# Patient Record
Sex: Female | Born: 1940 | Race: White | Hispanic: No | Marital: Single | Smoking: Never smoker
Health system: Southern US, Community
[De-identification: ages and names within clinical notes are randomized; demographics above are authoritative.]

## PROBLEM LIST (undated history)

## (undated) DIAGNOSIS — E039 Hypothyroidism, unspecified: Secondary | ICD-10-CM

## (undated) DIAGNOSIS — F32A Depression, unspecified: Secondary | ICD-10-CM

## (undated) DIAGNOSIS — M1990 Unspecified osteoarthritis, unspecified site: Secondary | ICD-10-CM

## (undated) DIAGNOSIS — E785 Hyperlipidemia, unspecified: Secondary | ICD-10-CM

## (undated) DIAGNOSIS — N1831 Chronic kidney disease, stage 3a: Secondary | ICD-10-CM

## (undated) DIAGNOSIS — K219 Gastro-esophageal reflux disease without esophagitis: Secondary | ICD-10-CM

## (undated) HISTORY — PX: DILATION AND CURETTAGE OF UTERUS: SHX78

---

## 1963-10-07 HISTORY — PX: WISDOM TOOTH EXTRACTION: SHX21

## 2009-11-15 ENCOUNTER — Ambulatory Visit: Payer: Self-pay | Admitting: Nephrology

## 2010-08-14 IMAGING — CR DG HIP COMPLETE 2+V*L*
1 series · 2 of 2 positions shown · non-contrast
Comparison: none

REASON FOR EXAM: lt hip pain x 2 months;hx arthritis & osteoporsis fax
result [id number]
COMMENTS:

PROCEDURE:     MDR - MDR HIP LEFT COMPLETE  - November 15, 2009  [DATE]
RESULT:     AP and lateral views of the left hip reveal the bones to be
adequately mineralized for age. I do not see evidence of an acute fracture.
No significant degenerative change is seen.

[Series 1: view not recorded · 0.17mm/px · 2 of 2 slices shown]
[im 1/2]
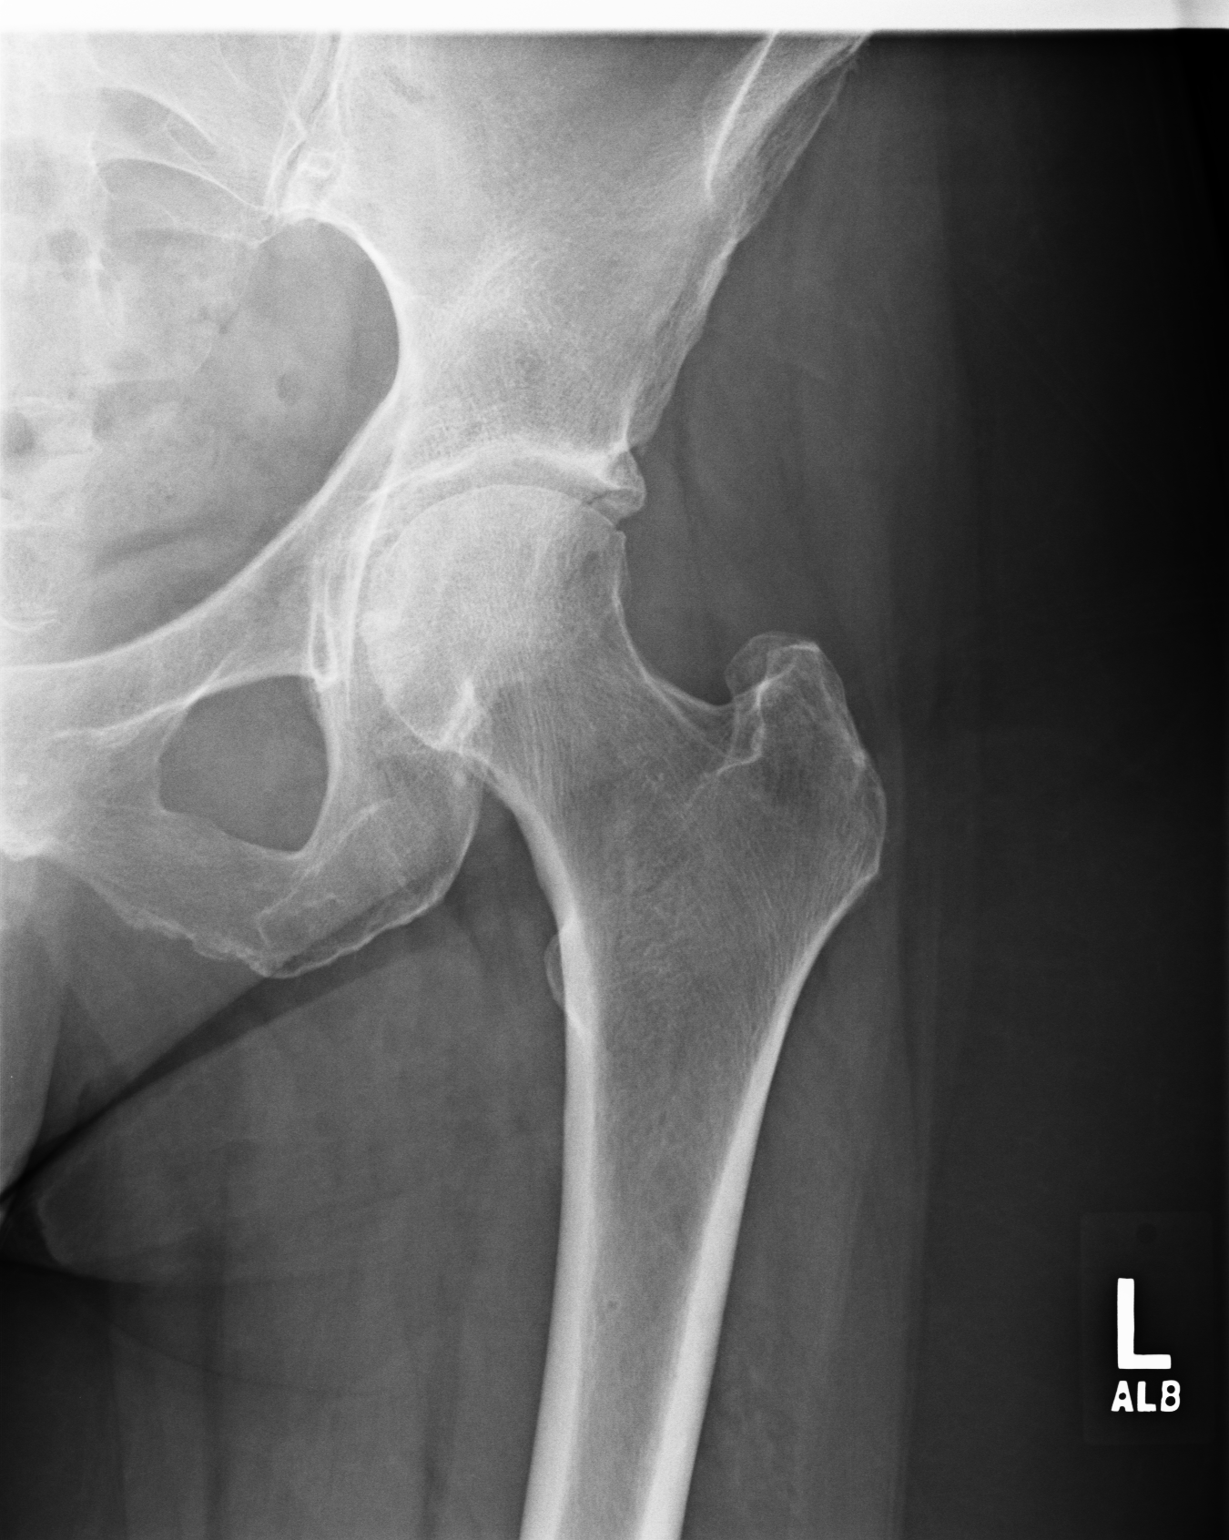
[im 2/2]
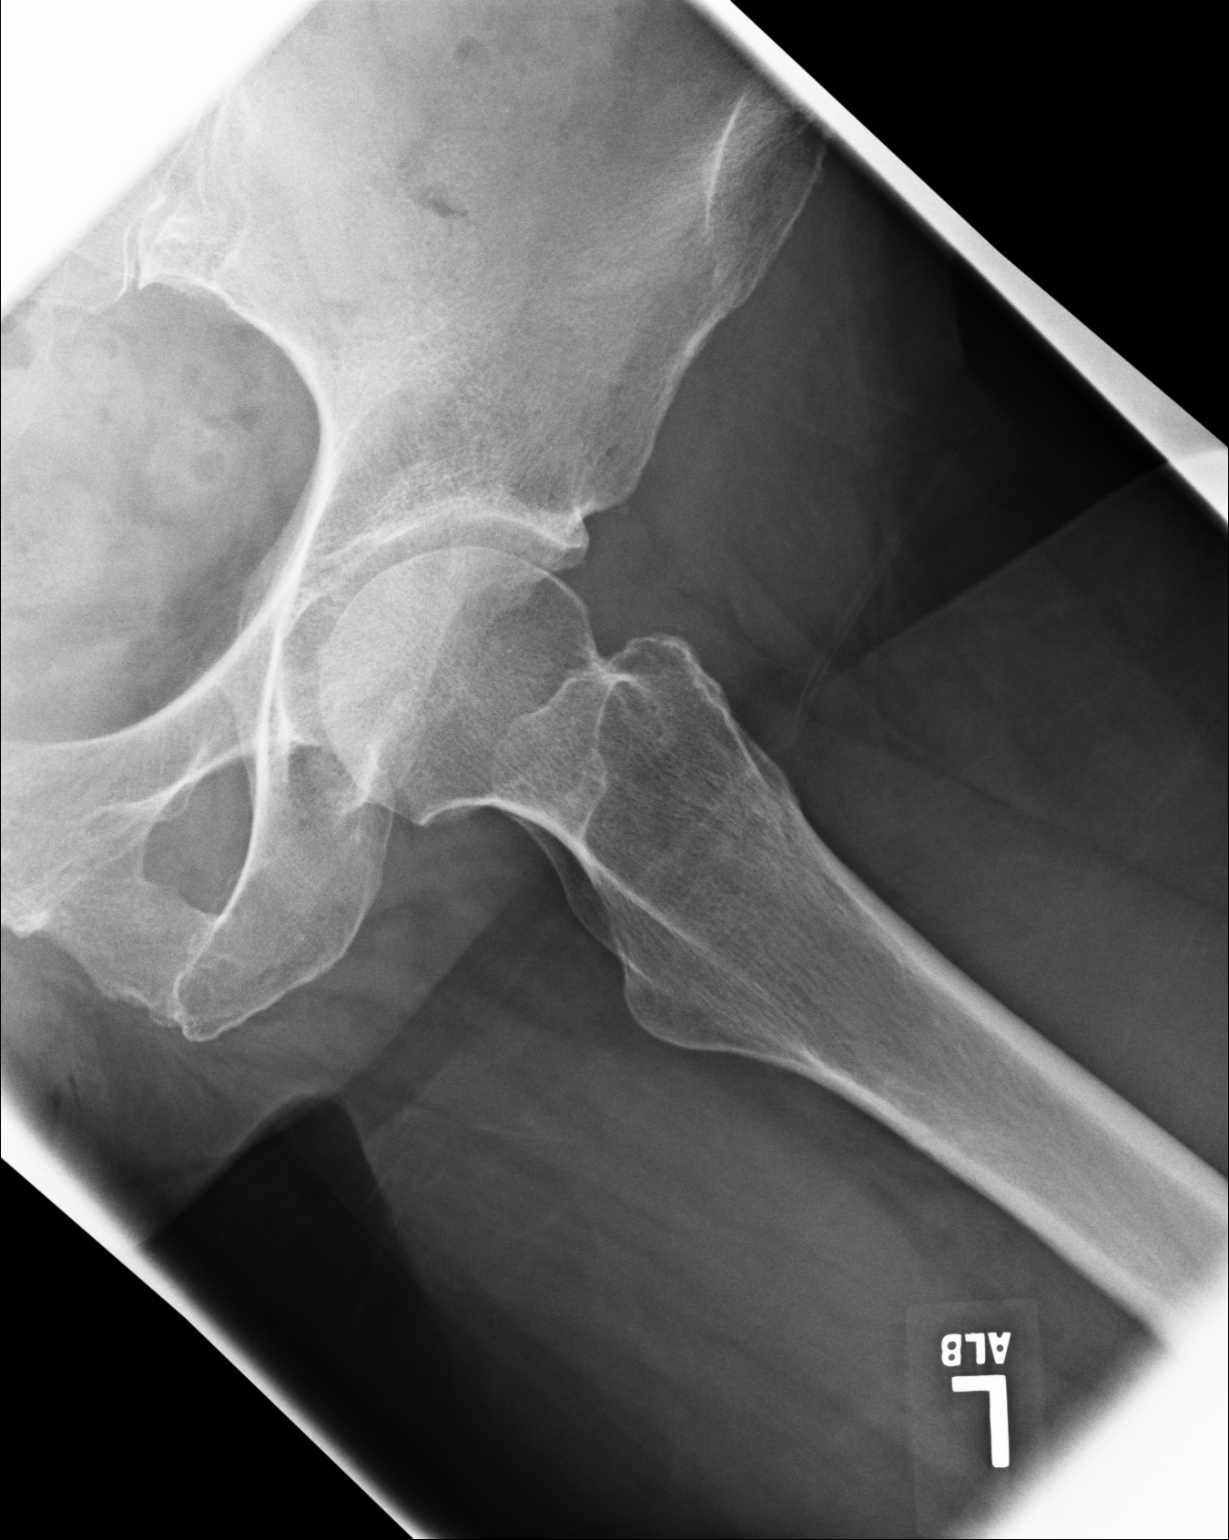

[2 of 2 positions shown; findings below may reference images not displayed]

IMPRESSION: I see no acute abnormality of the left hip. Given the
patient's persistent symptoms, followup MRI may be useful especially if
entities such as avascular necrosis are suspected.

## 2010-10-22 ENCOUNTER — Ambulatory Visit: Payer: Self-pay | Admitting: Internal Medicine

## 2011-07-30 ENCOUNTER — Ambulatory Visit: Payer: Self-pay | Admitting: Family Medicine

## 2012-01-01 ENCOUNTER — Ambulatory Visit: Payer: Self-pay | Admitting: Family Medicine

## 2016-07-19 ENCOUNTER — Ambulatory Visit
Admission: EM | Admit: 2016-07-19 | Discharge: 2016-07-19 | Disposition: A | Discharge status: Home / Self Care | Payer: Medicare Other

## 2020-04-23 ENCOUNTER — Other Ambulatory Visit: Payer: Self-pay

## 2020-07-17 NOTE — H&P (View-Only) (Signed)
Gastroenterology Consultation  Referring Provider:     Alan Mulder, MD Primary Care Physician:  Alan Mulder, MD Primary Gastroenterologist:  Dr. Servando Snare     Reason for Consultation:     Iron deficiency anemia and GERD        HPI:   Sarah Barrera is a 79 y.o. y/o female referred for consultation & management of iron deficiency anemia and GERD by Dr. Patrecia Pace, Delsa Sale, MD.  This patient was sent to me for evaluation for possible EGD and colonoscopy.  The patient had seen her primary care provider and per his note was reported to have iron deficiency anemia.  The patient was also noted to have heartburn and had stopped taking a H2 blocker and was on a proton pump inhibitor with good relief of her symptoms. The patient denies ever having an EGD or colonoscopy the past.  The patient is concerned because she has a relative whose heartburn has has resulted in esophageal cancer which has been metastatic and she is now concerned about her longstanding heartburn.  The patient is also concerned about taking a PPI.  There is no report of any unexplained weight loss fevers chills nausea vomiting black stools or bloody stools.    No past medical history on file.    Prior to Admission medications   Not on File    No family history on file.   Social History   Tobacco Use  . Smoking status: Not on file  Substance Use Topics  . Alcohol use: Not on file  . Drug use: Not on file    Allergies as of 07/18/2020 - Review Complete 07/18/2020  Allergen Reaction Noted  . Penicillin g  07/20/2014  . Venlafaxine  07/20/2014    Review of Systems:    All systems reviewed and negative except where noted in HPI.   Physical Exam:  Wt 176 lb 9.6 oz (80.1 kg)  No LMP recorded. General:   Alert,  Well-developed, well-nourished, pleasant and cooperative in NAD Head:  Normocephalic and atraumatic. Eyes:  Sclera clear, no icterus.   Conjunctiva pink. Ears:  Normal auditory acuity. Neck:   Supple; no masses or thyromegaly. Lungs:  Respirations even and unlabored.  Clear throughout to auscultation.   No wheezes, crackles, or rhonchi. No acute distress. Heart:  Regular rate and rhythm; no murmurs, clicks, rubs, or gallops. Abdomen:  Normal bowel sounds.  No bruits.  Soft, non-tender and non-distended without masses, hepatosplenomegaly or hernias noted.  No guarding or rebound tenderness.  Negative Carnett sign.   Rectal:  Deferred.  Pulses:  Normal pulses noted. Extremities:  No clubbing or edema.  No cyanosis. Neurologic:  Alert and oriented x3;  grossly normal neurologically. Skin:  Intact without significant lesions or rashes.  No jaundice. Lymph Nodes:  No significant cervical adenopathy. Psych:  Alert and cooperative. Normal mood and affect.  Imaging Studies: No results found.  Assessment and Plan:   Sarah Barrera is a 79 y.o. y/o female who comes in today with a history of chronic heartburn with some concern of taking PPI.  The patient has been explained the risks of a PPI in addition to the risk of having chronic heartburn.  The patient has been told that due to her referral stating that she had iron deficient anemia and chronic GERD she should be set up for an EGD and colonoscopy.  The patient has reluctantly accepted that recommendation and will be set up for an EGD  and colonoscopy.  The patient has been explained the plan agrees with it.    Lydian Chavous, MD. FACG    Note: This dictation was prepared with Dragon dictation along with smaller phrase technology. Any transcriptional errors that result from this process are unintentional.   

## 2020-07-17 NOTE — Progress Notes (Signed)
Gastroenterology Consultation  Referring Provider:     Alan Mulder, MD Primary Care Physician:  Alan Mulder, MD Primary Gastroenterologist:  Dr. Servando Snare     Reason for Consultation:     Iron deficiency anemia and GERD        HPI:   KOURTNEI RAUBER is a 79 y.o. y/o female referred for consultation & management of iron deficiency anemia and GERD by Dr. Patrecia Pace, Delsa Sale, MD.  This patient was sent to me for evaluation for possible EGD and colonoscopy.  The patient had seen her primary care provider and per his note was reported to have iron deficiency anemia.  The patient was also noted to have heartburn and had stopped taking a H2 blocker and was on a proton pump inhibitor with good relief of her symptoms. The patient denies ever having an EGD or colonoscopy the past.  The patient is concerned because she has a relative whose heartburn has has resulted in esophageal cancer which has been metastatic and she is now concerned about her longstanding heartburn.  The patient is also concerned about taking a PPI.  There is no report of any unexplained weight loss fevers chills nausea vomiting black stools or bloody stools.    No past medical history on file.    Prior to Admission medications   Not on File    No family history on file.   Social History   Tobacco Use  . Smoking status: Not on file  Substance Use Topics  . Alcohol use: Not on file  . Drug use: Not on file    Allergies as of 07/18/2020 - Review Complete 07/18/2020  Allergen Reaction Noted  . Penicillin g  07/20/2014  . Venlafaxine  07/20/2014    Review of Systems:    All systems reviewed and negative except where noted in HPI.   Physical Exam:  Wt 176 lb 9.6 oz (80.1 kg)  No LMP recorded. General:   Alert,  Well-developed, well-nourished, pleasant and cooperative in NAD Head:  Normocephalic and atraumatic. Eyes:  Sclera clear, no icterus.   Conjunctiva pink. Ears:  Normal auditory acuity. Neck:   Supple; no masses or thyromegaly. Lungs:  Respirations even and unlabored.  Clear throughout to auscultation.   No wheezes, crackles, or rhonchi. No acute distress. Heart:  Regular rate and rhythm; no murmurs, clicks, rubs, or gallops. Abdomen:  Normal bowel sounds.  No bruits.  Soft, non-tender and non-distended without masses, hepatosplenomegaly or hernias noted.  No guarding or rebound tenderness.  Negative Carnett sign.   Rectal:  Deferred.  Pulses:  Normal pulses noted. Extremities:  No clubbing or edema.  No cyanosis. Neurologic:  Alert and oriented x3;  grossly normal neurologically. Skin:  Intact without significant lesions or rashes.  No jaundice. Lymph Nodes:  No significant cervical adenopathy. Psych:  Alert and cooperative. Normal mood and affect.  Imaging Studies: No results found.  Assessment and Plan:   PAULETTE ROCKFORD is a 79 y.o. y/o female who comes in today with a history of chronic heartburn with some concern of taking PPI.  The patient has been explained the risks of a PPI in addition to the risk of having chronic heartburn.  The patient has been told that due to her referral stating that she had iron deficient anemia and chronic GERD she should be set up for an EGD and colonoscopy.  The patient has reluctantly accepted that recommendation and will be set up for an EGD  and colonoscopy.  The patient has been explained the plan agrees with it.    Midge Minium, MD. Clementeen Graham    Note: This dictation was prepared with Dragon dictation along with smaller phrase technology. Any transcriptional errors that result from this process are unintentional.

## 2020-07-18 ENCOUNTER — Ambulatory Visit: Payer: Medicare PPO | Admitting: Gastroenterology

## 2020-07-18 ENCOUNTER — Other Ambulatory Visit: Payer: Self-pay

## 2020-07-18 VITALS — BP 98/67 | HR 85 | Ht 62.0 in | Wt 176.6 lb

## 2020-07-18 DIAGNOSIS — M25579 Pain in unspecified ankle and joints of unspecified foot: Secondary | ICD-10-CM | POA: Insufficient documentation

## 2020-07-18 DIAGNOSIS — D500 Iron deficiency anemia secondary to blood loss (chronic): Secondary | ICD-10-CM

## 2020-07-18 DIAGNOSIS — Q6650 Congenital pes planus, unspecified foot: Secondary | ICD-10-CM | POA: Insufficient documentation

## 2020-07-18 DIAGNOSIS — K219 Gastro-esophageal reflux disease without esophagitis: Secondary | ICD-10-CM

## 2020-07-18 DIAGNOSIS — M76829 Posterior tibial tendinitis, unspecified leg: Secondary | ICD-10-CM | POA: Insufficient documentation

## 2020-07-18 MED ORDER — SUPREP BOWEL PREP KIT 17.5-3.13-1.6 GM/177ML PO SOLN: 1.0000 | ORAL | 0 refills | Status: DC

## 2020-07-19 ENCOUNTER — Other Ambulatory Visit: Payer: Self-pay

## 2020-07-19 ENCOUNTER — Encounter: Payer: Self-pay | Admitting: Gastroenterology

## 2020-07-19 ENCOUNTER — Other Ambulatory Visit
Admission: RE | Admit: 2020-07-19 | Discharge: 2020-07-19 | Disposition: A | Discharge status: Home / Self Care | Payer: Medicare PPO | Source: Ambulatory Visit | Attending: Gastroenterology | Admitting: Gastroenterology

## 2020-07-19 DIAGNOSIS — Z20822 Contact with and (suspected) exposure to covid-19: Secondary | ICD-10-CM | POA: Insufficient documentation

## 2020-07-19 DIAGNOSIS — Z01812 Encounter for preprocedural laboratory examination: Secondary | ICD-10-CM | POA: Insufficient documentation

## 2020-07-19 LAB — SARS CORONAVIRUS 2 (TAT 6-24 HRS): SARS Coronavirus 2: NEGATIVE

## 2020-07-19 NOTE — Discharge Instructions (Signed)
General Anesthesia, Adult, Care After This sheet gives you information about how to care for yourself after your procedure. Your health care provider may also give you more specific instructions. If you have problems or questions, contact your health care provider. What can I expect after the procedure? After the procedure, the following side effects are common:  Pain or discomfort at the IV site.  Nausea.  Vomiting.  Sore throat.  Trouble concentrating.  Feeling cold or chills.  Weak or tired.  Sleepiness and fatigue.  Soreness and body aches. These side effects can affect parts of the body that were not involved in surgery. Follow these instructions at home:  For at least 24 hours after the procedure:  Have a responsible adult stay with you. It is important to have someone help care for you until you are awake and alert.  Rest as needed.  Do not: ? Participate in activities in which you could fall or become injured. ? Drive. ? Use heavy machinery. ? Drink alcohol. ? Take sleeping pills or medicines that cause drowsiness. ? Make important decisions or sign legal documents. ? Take care of children on your own. Eating and drinking  Follow any instructions from your health care provider about eating or drinking restrictions.  When you feel hungry, start by eating small amounts of foods that are soft and easy to digest (bland), such as toast. Gradually return to your regular diet.  Drink enough fluid to keep your urine pale yellow.  If you vomit, rehydrate by drinking water, juice, or clear broth. General instructions  If you have sleep apnea, surgery and certain medicines can increase your risk for breathing problems. Follow instructions from your health care provider about wearing your sleep device: ? Anytime you are sleeping, including during daytime naps. ? While taking prescription pain medicines, sleeping medicines, or medicines that make you drowsy.  Return to  your normal activities as told by your health care provider. Ask your health care provider what activities are safe for you.  Take over-the-counter and prescription medicines only as told by your health care provider.  If you smoke, do not smoke without supervision.  Keep all follow-up visits as told by your health care provider. This is important. Contact a health care provider if:  You have nausea or vomiting that does not get better with medicine.  You cannot eat or drink without vomiting.  You have pain that does not get better with medicine.  You are unable to pass urine.  You develop a skin rash.  You have a fever.  You have redness around your IV site that gets worse. Get help right away if:  You have difficulty breathing.  You have chest pain.  You have blood in your urine or stool, or you vomit blood. Summary  After the procedure, it is common to have a sore throat or nausea. It is also common to feel tired.  Have a responsible adult stay with you for the first 24 hours after general anesthesia. It is important to have someone help care for you until you are awake and alert.  When you feel hungry, start by eating small amounts of foods that are soft and easy to digest (bland), such as toast. Gradually return to your regular diet.  Drink enough fluid to keep your urine pale yellow.  Return to your normal activities as told by your health care provider. Ask your health care provider what activities are safe for you. This information is not   intended to replace advice given to you by your health care provider. Make sure you discuss any questions you have with your health care provider. Document Revised: 09/25/2017 Document Reviewed: 05/08/2017 Elsevier Patient Education  2020 Elsevier Inc.  

## 2020-07-23 ENCOUNTER — Encounter
Admission: RE | Disposition: A | Discharge status: Home / Self Care | Payer: Self-pay | Source: Home / Self Care | Attending: Gastroenterology

## 2020-07-23 ENCOUNTER — Ambulatory Visit: Payer: Medicare PPO | Admitting: Anesthesiology

## 2020-07-23 ENCOUNTER — Other Ambulatory Visit: Payer: Self-pay

## 2020-07-23 ENCOUNTER — Ambulatory Visit
Admission: RE | Admit: 2020-07-23 | Discharge: 2020-07-23 | Disposition: A | Discharge status: Home / Self Care | Payer: Medicare PPO | Attending: Gastroenterology | Admitting: Gastroenterology

## 2020-07-23 ENCOUNTER — Encounter: Payer: Self-pay | Admitting: Gastroenterology

## 2020-07-23 DIAGNOSIS — D500 Iron deficiency anemia secondary to blood loss (chronic): Secondary | ICD-10-CM | POA: Diagnosis not present

## 2020-07-23 DIAGNOSIS — K449 Diaphragmatic hernia without obstruction or gangrene: Secondary | ICD-10-CM | POA: Insufficient documentation

## 2020-07-23 DIAGNOSIS — K640 First degree hemorrhoids: Secondary | ICD-10-CM | POA: Insufficient documentation

## 2020-07-23 DIAGNOSIS — Z800 Family history of malignant neoplasm of digestive organs: Secondary | ICD-10-CM | POA: Insufficient documentation

## 2020-07-23 DIAGNOSIS — K219 Gastro-esophageal reflux disease without esophagitis: Secondary | ICD-10-CM | POA: Diagnosis not present

## 2020-07-23 DIAGNOSIS — D509 Iron deficiency anemia, unspecified: Secondary | ICD-10-CM | POA: Insufficient documentation

## 2020-07-23 HISTORY — PX: ESOPHAGOGASTRODUODENOSCOPY (EGD) WITH PROPOFOL: SHX5813

## 2020-07-23 HISTORY — PX: COLONOSCOPY WITH PROPOFOL: SHX5780

## 2020-07-23 HISTORY — DX: Hypothyroidism, unspecified: E03.9

## 2020-07-23 HISTORY — DX: Hyperlipidemia, unspecified: E78.5

## 2020-07-23 HISTORY — DX: Gastro-esophageal reflux disease without esophagitis: K21.9

## 2020-07-23 SURGERY — COLONOSCOPY WITH PROPOFOL
Anesthesia: General | Site: Throat

## 2020-07-23 MED ORDER — LACTATED RINGERS IV SOLN: INTRAVENOUS | Status: DC

## 2020-07-23 MED ORDER — ONDANSETRON HCL 4 MG/2ML IJ SOLN: 4.0000 mg | Freq: Once | INTRAMUSCULAR | Status: DC | PRN

## 2020-07-23 MED ORDER — ACETAMINOPHEN 10 MG/ML IV SOLN: 1000.0000 mg | Freq: Once | INTRAVENOUS | Status: DC | PRN

## 2020-07-23 MED ORDER — PROPOFOL 10 MG/ML IV BOLUS
INTRAVENOUS | Status: DC | PRN
  Administered 2020-07-23 (×2): 40 mg via INTRAVENOUS
  Administered 2020-07-23: 150 mg via INTRAVENOUS
  Administered 2020-07-23: 40 mg via INTRAVENOUS
  Administered 2020-07-23: 50 mg via INTRAVENOUS

## 2020-07-23 MED ORDER — STERILE WATER FOR IRRIGATION IR SOLN
Status: DC | PRN
  Administered 2020-07-23: .05 mL

## 2020-07-23 MED ORDER — GLYCOPYRROLATE 0.2 MG/ML IJ SOLN
INTRAMUSCULAR | Status: DC | PRN
  Administered 2020-07-23: .1 mg via INTRAVENOUS

## 2020-07-23 MED ORDER — LIDOCAINE HCL (CARDIAC) PF 100 MG/5ML IV SOSY
PREFILLED_SYRINGE | INTRAVENOUS | Status: DC | PRN
  Administered 2020-07-23: 30 mg via INTRAVENOUS

## 2020-07-23 SURGICAL SUPPLY — 7 items
BLOCK BITE 60FR ADLT L/F GRN (MISCELLANEOUS) ×4 IMPLANT
GOWN CVR UNV OPN BCK APRN NK (MISCELLANEOUS) ×4 IMPLANT
GOWN ISOL THUMB LOOP REG UNIV (MISCELLANEOUS) ×8
KIT PRC NS LF DISP ENDO (KITS) ×2 IMPLANT
KIT PROCEDURE OLYMPUS (KITS) ×4
MANIFOLD NEPTUNE II (INSTRUMENTS) ×4 IMPLANT
WATER STERILE IRR 250ML POUR (IV SOLUTION) ×4 IMPLANT

## 2020-07-23 NOTE — Interval H&P Note (Signed)
History and Physical Interval Note:  07/23/2020 8:19 AM  Sarah Barrera  has presented today for surgery, with the diagnosis of Iron deficiency anemia D50.0 GERD K21.9.  The various methods of treatment have been discussed with the patient and family. After consideration of risks, benefits and other options for treatment, the patient has consented to  Procedure(s): COLONOSCOPY WITH PROPOFOL (N/A) ESOPHAGOGASTRODUODENOSCOPY (EGD) WITH PROPOFOL (N/A) as a surgical intervention.  The patient's history has been reviewed, patient examined, no change in status, stable for surgery.  I have reviewed the patient's chart and labs.  Questions were answered to the patient's satisfaction.     Arlyss Weathersby [employer]

## 2020-07-23 NOTE — Op Note (Signed)
[facility] Gastroenterology Patient Name: Sarah Barrera Procedure Date: 07/23/2020 8:55 AM MRN: [id number] Account #: [id number] Date of Birth: [birth year] Admit Type: Outpatient Age: 79 Room: [facility] OR ROOM 01 Gender: Female Note Status: Finalized Procedure:             Colonoscopy Indications:           Iron deficiency anemia Providers:             Midge Minium MD, MD Referring MD:          Alan Mulder, MD (Referring MD) Medicines:             Propofol per Anesthesia Complications:         No immediate complications. Procedure:             Pre-Anesthesia Assessment:                        - Prior to the procedure, a History and Physical was                         performed, and patient medications and allergies were                         reviewed. The patient's tolerance of previous                         anesthesia was also reviewed. The risks and benefits                         of the procedure and the sedation options and risks                         were discussed with the patient. All questions were                         answered, and informed consent was obtained. Prior                         Anticoagulants: The patient has taken no previous                         anticoagulant or antiplatelet agents. ASA Grade                         Assessment: II - A patient with mild systemic disease.                         After reviewing the risks and benefits, the patient                         was deemed in satisfactory condition to undergo the                         procedure.                        After obtaining informed consent, the colonoscope was  passed under direct vision. Throughout the procedure,                         the patient's blood pressure, pulse, and oxygen                         saturations were monitored continuously. The was                         introduced through the anus and advanced to the the                          cecum, identified by appendiceal orifice and ileocecal                         valve. The colonoscopy was performed without                         difficulty. The patient tolerated the procedure well.                         The quality of the bowel preparation was excellent. Findings:      The perianal and digital rectal examinations were normal.      Non-bleeding internal hemorrhoids were found during retroflexion. The       hemorrhoids were Grade I (internal hemorrhoids that do not prolapse). Impression:            - Non-bleeding internal hemorrhoids.                        - No specimens collected. Recommendation:        - Discharge patient to home.                        - Resume previous diet.                        - Continue present medications.                        - To visualize the small bowel, perform video capsule                         endoscopy. Procedure Code(s):     --- Professional ---                        [phone], Colonoscopy, flexible; diagnostic, including                         collection of specimen(s) by brushing or washing, when                         performed (separate procedure) Diagnosis Code(s):     --- Professional ---                        D50.9, Iron deficiency anemia, unspecified CPT copyright 2019 American Medical Association. All rights reserved. The codes documented in this report are preliminary and upon coder review may  be revised to meet current compliance requirements. [name]  [name] MD, MD 07/23/2020 9:25:39 AM This report has been signed electronically. Number of Addenda: 0 Note Initiated On: 07/23/2020 8:55 AM Scope Withdrawal Time: 0 hours 12 minutes 18 seconds  Total Procedure Duration: 0 hours 16 minutes 12 seconds  Estimated Blood Loss:  Estimated blood loss: none.      [facility]

## 2020-07-23 NOTE — Anesthesia Postprocedure Evaluation (Signed)
Anesthesia Post Note  Patient: Sarah Barrera  Procedure(s) Performed: COLONOSCOPY WITH PROPOFOL (N/Barrera ) ESOPHAGOGASTRODUODENOSCOPY (EGD) WITH PROPOFOL (N/Barrera Throat)     Patient location during evaluation: PACU Anesthesia Type: General Level of consciousness: awake and alert Pain management: pain level controlled Vital Signs Assessment: post-procedure vital signs reviewed and stable Respiratory status: spontaneous breathing, nonlabored ventilation, respiratory function stable and patient connected to nasal cannula oxygen Cardiovascular status: blood pressure returned to baseline and stable Postop Assessment: no apparent nausea or vomiting Anesthetic complications: no   No complications documented.  Sarah Barrera  Elky Funches

## 2020-07-23 NOTE — Anesthesia Procedure Notes (Signed)
Date/Time: 07/23/2020 8:56 AM Performed by: Maree Krabbe, CRNA Pre-anesthesia Checklist: Patient identified, Emergency Drugs available, Suction available, Timeout performed and Patient being monitored Patient Re-evaluated:Patient Re-evaluated prior to induction Oxygen Delivery Method: Nasal cannula Placement Confirmation: positive ETCO2

## 2020-07-23 NOTE — Op Note (Signed)
[facility] Gastroenterology Patient Name: Sarah Barrera Procedure Date: 07/23/2020 8:53 AM MRN: [id number] Account #: [id number] Date of Birth: Jul 20, 1941 Admit Type: Outpatient Age: 79 Room: [facility] OR ROOM 01 Gender: Female Note Status: Finalized Procedure:             Upper GI endoscopy Indications:           Iron deficiency anemia Providers:             Midge Minium MD, MD Referring MD:          Alan Mulder, MD (Referring MD) Medicines:             Propofol per Anesthesia Complications:         No immediate complications. Procedure:             Pre-Anesthesia Assessment:                        - Prior to the procedure, a History and Physical was                         performed, and patient medications and allergies were                         reviewed. The patient's tolerance of previous                         anesthesia was also reviewed. The risks and benefits                         of the procedure and the sedation options and risks                         were discussed with the patient. All questions were                         answered, and informed consent was obtained. Prior                         Anticoagulants: The patient has taken no previous                         anticoagulant or antiplatelet agents. ASA Grade                         Assessment: II - A patient with mild systemic disease.                         After reviewing the risks and benefits, the patient                         was deemed in satisfactory condition to undergo the                         procedure.                        After obtaining informed consent, the endoscope was  passed under direct vision. Throughout the procedure,                         the patient's blood pressure, pulse, and oxygen                         saturations were monitored continuously. The was                         introduced through the mouth, and advanced to  the                         second part of duodenum. The upper GI endoscopy was                         accomplished without difficulty. The patient tolerated                         the procedure well. Findings:      An 8 cm hiatal hernia was present.      The stomach was normal.      The examined duodenum was normal. Impression:            - 8 cm hiatal hernia.                        - Normal stomach.                        - Normal examined duodenum.                        - No specimens collected. Recommendation:        - Discharge patient to home.                        - Resume previous diet.                        - Continue present medications.                        - Perform a colonoscopy today. Procedure Code(s):     --- Professional ---                        [phone], Esophagogastroduodenoscopy, flexible,                         transoral; diagnostic, including collection of                         specimen(s) by brushing or washing, when performed                         (separate procedure) Diagnosis Code(s):     --- Professional ---                        D50.9, Iron deficiency anemia, unspecified CPT copyright 2019 American Medical Association. All rights reserved. The codes documented in this report are preliminary and upon coder review may  be revised to meet  current compliance requirements. Midge Minium MD, MD 07/23/2020 9:05:05 AM This report has been signed electronically. Number of Addenda: 0 Note Initiated On: 07/23/2020 8:53 AM Estimated Blood Loss:  Estimated blood loss: none.      [facility]

## 2020-07-23 NOTE — Anesthesia Preprocedure Evaluation (Signed)
Anesthesia Evaluation  Patient identified by MRN, date of birth, ID band Patient awake    Reviewed: Allergy & Precautions, NPO status , Patient's Chart, lab work & pertinent test results, reviewed documented beta blocker date and time   History of Anesthesia Complications Negative for: history of anesthetic complications  Airway Mallampati: II  TM Distance: <3 FB Neck ROM: Full    Dental   Pulmonary    breath sounds clear to auscultation       Cardiovascular (-) angina(-) DOE  Rhythm:Regular Rate:Normal   HLD   Neuro/Psych    GI/Hepatic GERD  Controlled,  Endo/Other  Hypothyroidism   Renal/GU      Musculoskeletal   Abdominal (+) + obese (BMI 31),   Peds  Hematology   Anesthesia Other Findings   Reproductive/Obstetrics                            Anesthesia Physical Anesthesia Plan  ASA: III  Anesthesia Plan: General   Post-op Pain Management:    Induction: Intravenous  PONV Risk Score and Plan: 3 and Propofol infusion, TIVA and Treatment may vary due to age or medical condition  Airway Management Planned: Natural Airway and Nasal Cannula  Additional Equipment:   Intra-op Plan:   Post-operative Plan:   Informed Consent: I have reviewed the patients History and Physical, chart, labs and discussed the procedure including the risks, benefits and alternatives for the proposed anesthesia with the patient or authorized representative who has indicated his/her understanding and acceptance.       Plan Discussed with: CRNA and Anesthesiologist  Anesthesia Plan Comments:         Anesthesia Quick Evaluation

## 2020-07-23 NOTE — Transfer of Care (Signed)
Immediate Anesthesia Transfer of Care Note  Patient: Sarah Barrera  Procedure(s) Performed: COLONOSCOPY WITH PROPOFOL (N/A ) ESOPHAGOGASTRODUODENOSCOPY (EGD) WITH PROPOFOL (N/A Throat)  Patient Location: PACU  Anesthesia Type: General  Level of Consciousness: awake, alert  and patient cooperative  Airway and Oxygen Therapy: Patient Spontanous Breathing and Patient connected to supplemental oxygen  Post-op Assessment: Post-op Vital signs reviewed, Patient's Cardiovascular Status Stable, Respiratory Function Stable, Patent Airway and No signs of Nausea or vomiting  Post-op Vital Signs: Reviewed and stable  Complications: No complications documented.

## 2020-07-24 ENCOUNTER — Encounter: Payer: Self-pay | Admitting: Gastroenterology

## 2020-08-14 ENCOUNTER — Other Ambulatory Visit: Payer: Self-pay

## 2020-08-14 ENCOUNTER — Telehealth: Payer: Self-pay

## 2020-08-14 DIAGNOSIS — D500 Iron deficiency anemia secondary to blood loss (chronic): Secondary | ICD-10-CM

## 2020-08-14 NOTE — Progress Notes (Signed)
Procedure has been scheduled. Instructions sent via my chart and mailed to home address.

## 2020-08-14 NOTE — Telephone Encounter (Signed)
Called patient to schedule an capsule study per Dr. Servando Snare. LVM to call office back to schedule.

## 2020-09-12 ENCOUNTER — Telehealth: Payer: Self-pay | Admitting: Gastroenterology

## 2020-09-12 NOTE — Telephone Encounter (Signed)
The pacemaker question was addressed back in November. Unsure why she is calling about his again. Nothing further to do for this patient. Waiting on Capsule study scheduled for 09/18/20.

## 2020-09-12 NOTE — Telephone Encounter (Signed)
Patient calling Ginger back stating she does not have a Visual merchandiser. She received a vm asking about it to get approval from ins [employer] for capsule study scheduled for 12.14.21. There was no prior note in system. FYI

## 2020-09-18 ENCOUNTER — Encounter
Admission: RE | Disposition: A | Discharge status: Home / Self Care | Payer: Self-pay | Source: Home / Self Care | Attending: Gastroenterology

## 2020-09-18 ENCOUNTER — Ambulatory Visit
Admission: RE | Admit: 2020-09-18 | Discharge: 2020-09-18 | Disposition: A | Discharge status: Home / Self Care | Payer: Medicare PPO | Attending: Gastroenterology | Admitting: Gastroenterology

## 2020-09-18 DIAGNOSIS — D509 Iron deficiency anemia, unspecified: Secondary | ICD-10-CM

## 2020-09-18 HISTORY — PX: GIVENS CAPSULE STUDY: SHX5432

## 2020-09-18 SURGERY — IMAGING PROCEDURE, GI TRACT, INTRALUMINAL, VIA CAPSULE

## 2020-09-19 ENCOUNTER — Encounter: Payer: Self-pay | Admitting: Gastroenterology

## 2020-10-11 ENCOUNTER — Telehealth: Payer: Self-pay | Admitting: Gastroenterology

## 2020-10-11 NOTE — Telephone Encounter (Signed)
Pt calling for results from her procedure Please call to advise

## 2020-10-11 NOTE — Telephone Encounter (Signed)
Responded to a mychart message that pt had sent in regards to her Capsule study results. Waiting on provider to read and advise.

## 2020-11-01 ENCOUNTER — Other Ambulatory Visit: Payer: Self-pay

## 2020-11-02 ENCOUNTER — Telehealth: Payer: Self-pay

## 2020-11-02 NOTE — Telephone Encounter (Signed)
-----   Message from Darren Wohl, MD sent at 11/01/2020 11:57 AM EST ----- Let the patient know that the capsule study was normal for the entire exam and that there is no sign of any GI bleeding.  She should continue taking iron and following her hemoglobin and hematocrit. 

## 2020-11-02 NOTE — Telephone Encounter (Signed)
-----   Message from Midge Minium, MD sent at 11/01/2020 11:57 AM EST ----- Let the patient know that the capsule study was normal for the entire exam and that there is no sign of any GI bleeding.  She should continue taking iron and following her hemoglobin and hematocrit.

## 2020-11-02 NOTE — Telephone Encounter (Signed)
Pt notified of capsule study results via mychart.  

## 2021-08-18 ENCOUNTER — Encounter: Payer: Self-pay | Admitting: Emergency Medicine

## 2021-08-18 ENCOUNTER — Other Ambulatory Visit: Payer: Self-pay

## 2021-08-18 ENCOUNTER — Ambulatory Visit
Admission: EM | Admit: 2021-08-18 | Discharge: 2021-08-18 | Disposition: A | Discharge status: Home / Self Care | Payer: Medicare PPO | Attending: Physician Assistant | Admitting: Physician Assistant

## 2021-08-18 DIAGNOSIS — E039 Hypothyroidism, unspecified: Secondary | ICD-10-CM | POA: Diagnosis not present

## 2021-08-18 DIAGNOSIS — Z20822 Contact with and (suspected) exposure to covid-19: Secondary | ICD-10-CM | POA: Insufficient documentation

## 2021-08-18 DIAGNOSIS — J101 Influenza due to other identified influenza virus with other respiratory manifestations: Secondary | ICD-10-CM | POA: Diagnosis not present

## 2021-08-18 DIAGNOSIS — R0981 Nasal congestion: Secondary | ICD-10-CM | POA: Diagnosis not present

## 2021-08-18 DIAGNOSIS — R051 Acute cough: Secondary | ICD-10-CM | POA: Insufficient documentation

## 2021-08-18 DIAGNOSIS — I10 Essential (primary) hypertension: Secondary | ICD-10-CM | POA: Insufficient documentation

## 2021-08-18 DIAGNOSIS — R0789 Other chest pain: Secondary | ICD-10-CM | POA: Insufficient documentation

## 2021-08-18 LAB — RESP PANEL BY RT-PCR (FLU A&B, COVID) ARPGX2
Influenza A by PCR: POSITIVE — AB
Influenza B by PCR: NEGATIVE
SARS Coronavirus 2 by RT PCR: NEGATIVE

## 2021-08-18 MED ORDER — OSELTAMIVIR PHOSPHATE 75 MG PO CAPS: 75.0000 mg | ORAL_CAPSULE | Freq: Two times a day (BID) | ORAL | 0 refills | Status: AC

## 2021-08-18 NOTE — Discharge Instructions (Addendum)
-  Flu test is + -I prescribed Tamiflu,  but you will need to contact pharmacies to see if they have it, many are out.  Hopefully this makes symptoms more mild and shorten the course by a day or 2. -Take over the counter cough medication and nasal sprays.  Make sure to rest and increase fluid intake. - Tylenol and/or Motrin as needed for fever control. - Symptoms should resolve within 7 to 10 days.  The first 3 to 5 days people generally feels worse. - You should be seen again if you have uncontrollable fever, weakness, chest pain, breathing difficulty, etc.

## 2021-08-18 NOTE — ED Provider Notes (Signed)
[facility]    CSN: [id number] Arrival date & time: 08/18/21  0806      History   Chief Complaint Chief Complaint  Patient presents with   Sinus Problem   Cough    HPI Sarah Barrera is a 80 y.o. female presenting for 2-day history of fatigue, sinus congestion and pressure, low-grade temps up to 100 degrees, cough and chest tightness.  She reports that on Friday she had a sharp pain of the right ribs which she had to take Tylenol for.  She says it has eased up some.  Denies any breathing difficulty, vomiting or diarrhea.  Has not taken any OTC meds for symptoms.  Patient reports going out of town this past week to [place] and states she rode on a bus.  Unsure if she was around any sick contacts.  No known COVID or flu exposure.  Patient is otherwise healthy with a history of hypertension and hypothyroidism.  She has no other complaints today.  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    Past Surgical History:  Procedure Laterality Date   COLONOSCOPY WITH PROPOFOL N/A 07/23/2020   Procedure: COLONOSCOPY WITH PROPOFOL;  Surgeon: Lucilla Lame, MD;  Location: [place];  Service: Endoscopy;  Laterality: N/A;   DILATION AND CURETTAGE OF UTERUS     several during 1980s   ESOPHAGOGASTRODUODENOSCOPY (EGD) WITH PROPOFOL N/A 07/23/2020   Procedure: ESOPHAGOGASTRODUODENOSCOPY (EGD) WITH PROPOFOL;  Surgeon: Lucilla Lame, MD;  Location: [place];  Service: Endoscopy;  Laterality: N/A;   GIVENS CAPSULE STUDY N/A 09/18/2020   Procedure: GIVENS CAPSULE STUDY;  Surgeon: Lucilla Lame, MD;  Location[facility] ENDOSCOPY;  Service: Endoscopy;  Laterality: N/A;    [place]    OB History   No obstetric history on file.      Home Medications    Prior to Admission medications   Medication Sig Start Date End Date Taking? Authorizing Provider  escitalopram (LEXAPRO) 10 MG tablet  06/13/20  Yes [provider]  Magnesium 250 MG TABS Take by mouth daily.   Yes [provider]  omeprazole (PRILOSEC) 40 MG capsule  06/13/20  Yes [provider]  oseltamivir (TAMIFLU) 75 MG capsule Take 1 capsule (75 mg total) by mouth every 12 (twelve) hours for 5 days. 08/18/21 08/23/21 Yes Laurene Footman B, PA-C  Probiotic Product (PROBIOTIC PO) Take by mouth.   Yes [provider]  rosuvastatin (CRESTOR) 20 MG tablet  04/20/20  Yes [provider]  SYNTHROID 100 MCG tablet  04/20/20  Yes [provider]  Vitamin D, Ergocalciferol, (DRISDOL) 1.25 MG (50000 UNIT) CAPS capsule  06/13/20  Yes [provider]  cyclobenzaprine (FLEXERIL) 10 MG tablet cyclobenzaprine 10 mg tablet  Take 1 tablet every day by oral route as needed. Patient not taking: No sig reported    [provider]  meloxicam (MOBIC) 15 MG tablet meloxicam 15 mg tablet Patient not taking: No sig reported    [provider]  Na Sulfate-K Sulfate-Mg Sulf (SUPREP BOWEL PREP KIT) 17.5-3.13-1.6 GM/177ML SOLN Take 1 kit by mouth as directed. 07/18/20   Wohl,  Darren, MD  oxybutynin (DITROPAN) 5 MG tablet oxybutynin chloride 5 mg tablet 06/14/18   [provider]    Family History History reviewed. No pertinent family history.  Social History Social History   Tobacco Use   Smoking status: Never   Smokeless tobacco: Never  Vaping Use   Vaping Use: Never used  Substance Use Topics   Alcohol use: Not Currently     Allergies   Venlafaxine and Penicillin g   Review of Systems Review of Systems  Constitutional:  Positive for fatigue. Negative for chills, diaphoresis and fever.  HENT:  Positive for  congestion, rhinorrhea and sinus pressure. Negative for ear pain, sinus pain and sore throat.   Respiratory:  Positive for cough and chest tightness. Negative for shortness of breath and wheezing.   Cardiovascular:  Negative for chest pain.  Gastrointestinal:  Negative for abdominal pain, nausea and vomiting.  Musculoskeletal:  Negative for arthralgias and myalgias.  Skin:  Negative for rash.  Neurological:  Positive for headaches. Negative for weakness.  Hematological:  Negative for adenopathy.    Physical Exam Triage Vital Signs ED Triage Vitals  Enc Vitals Group     BP      Pulse      Resp      Temp      Temp src      SpO2      Weight      Height      Head Circumference      Peak Flow      Pain Score      Pain Loc      Pain Edu?      Excl. in [place]?    No data found.  Updated Vital Signs BP 132/85 (BP Location: Left Arm)   Pulse 90   Temp 99.9 F (37.7 C) (Oral)   Resp 14   Ht _0  (1.575 m)   Wt 176 lb (79.8 kg)   SpO2 99%   BMI 32.19 kg/m      Physical Exam Vitals and nursing note reviewed.  Constitutional:      General: She is not in acute distress.    Appearance: Normal appearance. She is not ill-appearing or toxic-appearing.  HENT:     Head: Normocephalic and atraumatic.     Right Ear: Ear canal and external ear normal. Decreased hearing noted. A middle ear effusion is present. Tympanic membrane is not erythematous or bulging.     Left Ear: Ear canal and external ear normal. Decreased hearing noted. A middle ear effusion is present. Tympanic membrane is not erythematous or bulging.     Nose: Congestion present.     Mouth/Throat:     Mouth: Mucous membranes are moist.     Pharynx: Oropharynx is clear. Posterior oropharyngeal erythema (mild with clear PND) present.  Eyes:     General: No scleral icterus.       Right eye: No discharge.        Left eye: No discharge.     Conjunctiva/sclera: Conjunctivae normal.  Cardiovascular:     Rate and Rhythm:  Normal rate and regular rhythm.     Heart sounds: Normal heart sounds.  Pulmonary:     Effort: Pulmonary effort is normal. No respiratory distress.     Breath sounds: Normal breath sounds.  Musculoskeletal:     Cervical back: Neck supple.  Skin:    General: Skin is dry.  Neurological:     General: No focal deficit present.  Mental Status: She is alert. Mental status is at baseline.     Motor: No weakness.     Gait: Gait normal.  Psychiatric:        Mood and Affect: Mood normal.        Behavior: Behavior normal.        Thought Content: Thought content normal.     UC Treatments / Results  Labs (all labs ordered are listed, but only abnormal results are displayed) Labs Reviewed  RESP PANEL BY RT-PCR (FLU A&B, COVID) ARPGX2 - Abnormal; Notable for the following components:      Result Value   Influenza A by PCR POSITIVE (*)    All other components within normal limits    EKG   Radiology No results found.  Procedures Procedures (including critical care time)  Medications Ordered in UC Medications - No data to display  Initial Impression / Assessment and Plan / UC Course  I have reviewed the triage vital signs and the nursing notes.  Pertinent labs & imaging results that were available during my care of the patient were reviewed by me and considered in my medical decision making (see chart for details).  80 year old female presenting for 2-day history of fatigue, low-grade temps, cough, congestion, sinus pressure and ear pressure.  Also reports chest tightness but no breathing difficulty.  No weakness.  No vomiting or diarrhea.  Temp currently 99.9 degrees.  Other vital signs are stable as well.  Patient is overall well-appearing. Patient is working on a crossword puzzle in her exam room.  Exam does reveal nasal congestion, postnasal drainage.  Chest clear to auscultation and heart regular rate and rhythm.  Respiratory panel obtained to assess for possible influenza  or COVID-19.  If negative testing, will perform chest x-ray to assess for the possibility of pneumonia given her chest tightness, low-grade temps and cough.  Respiratory panel positive for influenza A.  Discussed this with patient.  She would like to try Tamiflu so I have printed a prescription and advised her to contact pharmacies to get the prescription.  Many the pharmacies are out of this medication in the area.  Offer cough syrup and nasal spray but she declines.  Will use over-the-counter products.  Tylenol for fever.  Advised to increase rest and fluids.  Reviewed return and ED precautions.  Final Clinical Impressions(s) / UC Diagnoses   Final diagnoses:  Influenza A  Acute cough  Nasal congestion     Discharge Instructions      -Flu test is + -I prescribed Tamiflu,  but you will need to contact pharmacies to see if they have it, many are out.  Hopefully this makes symptoms more mild and shorten the course by a day or 2. -Take over the counter cough medication and nasal sprays.  Make sure to rest and increase fluid intake. - Tylenol and/or Motrin as needed for fever control. - Symptoms should resolve within 7 to 10 days.  The first 3 to 5 days people generally feels worse. - You should be seen again if you have uncontrollable fever, weakness, chest pain, breathing difficulty, etc.      ED Prescriptions     Medication Sig Dispense Auth. Provider   oseltamivir (TAMIFLU) 75 MG capsule Take 1 capsule (75 mg total) by mouth every 12 (twelve) hours for 5 days. 10 capsule Danton Clap, PA-C      PDMP not reviewed this encounter.   Danton Clap, PA-C 08/18/21 [phone]

## 2021-08-18 NOTE — ED Triage Notes (Signed)
Patient c/o sinus congestion and pressure and cough and stuffy nose that started on Friday.  Patient denies fevers.

## 2022-11-15 ENCOUNTER — Ambulatory Visit
Admission: EM | Admit: 2022-11-15 | Discharge: 2022-11-15 | Disposition: A | Discharge status: Home / Self Care | Payer: Medicare PPO | Attending: Family Medicine | Admitting: Family Medicine

## 2022-11-15 ENCOUNTER — Encounter: Payer: Self-pay | Admitting: Family Medicine

## 2022-11-15 ENCOUNTER — Ambulatory Visit (INDEPENDENT_AMBULATORY_CARE_PROVIDER_SITE_OTHER): Payer: Medicare PPO

## 2022-11-15 DIAGNOSIS — Z1152 Encounter for screening for COVID-19: Secondary | ICD-10-CM | POA: Diagnosis not present

## 2022-11-15 DIAGNOSIS — K449 Diaphragmatic hernia without obstruction or gangrene: Secondary | ICD-10-CM | POA: Diagnosis not present

## 2022-11-15 DIAGNOSIS — R051 Acute cough: Secondary | ICD-10-CM

## 2022-11-15 DIAGNOSIS — J988 Other specified respiratory disorders: Secondary | ICD-10-CM | POA: Diagnosis present

## 2022-11-15 DIAGNOSIS — B9789 Other viral agents as the cause of diseases classified elsewhere: Secondary | ICD-10-CM | POA: Insufficient documentation

## 2022-11-15 LAB — SARS CORONAVIRUS 2 BY RT PCR: SARS Coronavirus 2 by RT PCR: NEGATIVE

## 2022-11-15 LAB — GROUP A STREP BY PCR: Group A Strep by PCR: NOT DETECTED

## 2022-11-15 MED ORDER — IPRATROPIUM BROMIDE 0.06 % NA SOLN: 2.0000 | Freq: Four times a day (QID) | NASAL | 12 refills | Status: DC

## 2022-11-15 MED ORDER — PROMETHAZINE-DM 6.25-15 MG/5ML PO SYRP: 2.5000 mL | ORAL_SOLUTION | Freq: Four times a day (QID) | ORAL | 0 refills | Status: DC | PRN

## 2022-11-15 NOTE — ED Provider Notes (Signed)
[facility]    CSN: [id number] Arrival date & time: 11/15/22  0854      History   Chief Complaint Chief Complaint  Patient presents with   Cough    HPI Sarah Barrera is a 82 y.o. female.   HPI   Sarah Barrera presents for cough, ear pressure, sore throat, chest congestion that started about 5 days ago. Used vicks vapor rub and 81 mg aspirin with Motin PM which has helped some but she feels like her symptoms are worsening. No fever, vomiting, diarrhea, abdominal pain. She has a headache. No recent sick contacts.   Denies smoking history or asthma.      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    Past Surgical History:  Procedure Laterality Date   COLONOSCOPY WITH PROPOFOL N/A 07/23/2020   Procedure: COLONOSCOPY WITH PROPOFOL;  Surgeon: Lucilla Lame, MD;  Location: [place];  Service: Endoscopy;  Laterality: N/A;   DILATION AND CURETTAGE OF UTERUS     several during 1980s   ESOPHAGOGASTRODUODENOSCOPY (EGD) WITH PROPOFOL N/A 07/23/2020   Procedure: ESOPHAGOGASTRODUODENOSCOPY (EGD) WITH PROPOFOL;  Surgeon: Lucilla Lame, MD;  Location: [place];  Service: Endoscopy;  Laterality: N/A;   GIVENS CAPSULE STUDY N/A 09/18/2020   Procedure: GIVENS CAPSULE STUDY;  Surgeon: Lucilla Lame, MD;  Location: [facility] ENDOSCOPY;  Service: Endoscopy;  Laterality: N/A;   [place]    OB History   No obstetric history on file.      Home Medications    Prior to Admission medications   Medication Sig Start Date End Date Taking? Authorizing Provider  ipratropium (ATROVENT) 0.06 % nasal spray Place 2 sprays into both nostrils 4 (four)  times daily. 11/15/22  Yes Maykayla Highley, DO  promethazine-dextromethorphan (PROMETHAZINE-DM) 6.25-15 MG/5ML syrup Take 2.5 mLs by mouth 4 (four) times daily as needed. 11/15/22  Yes Erinne Gillentine, DO  cyclobenzaprine (FLEXERIL) 10 MG tablet cyclobenzaprine 10 mg tablet  Take 1 tablet every day by oral route as needed. Patient not taking: No sig reported    [provider]  escitalopram (LEXAPRO) 10 MG tablet  06/13/20   [provider]  Magnesium 250 MG TABS Take by mouth daily.    [provider]  meloxicam (MOBIC) 15 MG tablet meloxicam 15 mg tablet Patient not taking: No sig reported    [provider]  Na Sulfate-K Sulfate-Mg Sulf (SUPREP BOWEL PREP KIT) 17.5-3.13-1.6 GM/177ML SOLN Take 1 kit by mouth as directed. 07/18/20   Lucilla Lame, MD  omeprazole (PRILOSEC) 40 MG capsule  06/13/20   [provider]  oxybutynin (DITROPAN) 5 MG tablet oxybutynin chloride 5 mg tablet 06/14/18   [provider]  Probiotic Product (PROBIOTIC PO) Take by mouth.    [provider]  rosuvastatin (CRESTOR) 20 MG tablet  04/20/20   [provider]  SYNTHROID 100 MCG tablet  04/20/20   [provider]  Vitamin D, Ergocalciferol, (DRISDOL) 1.25 MG (50000 UNIT) CAPS capsule  06/13/20   [provider]    Family History History reviewed. No pertinent family history.  Social History Social History   Tobacco Use  Smoking status: Never   Smokeless tobacco: Never  Vaping Use   Vaping Use: Never used  Substance Use Topics   Alcohol use: Not Currently   Drug use: Never     Allergies   Venlafaxine and Penicillin g   Review of Systems Review of Systems: negative unless otherwise stated in HPI.      Physical Exam Triage Vital Signs ED Triage Vitals  Enc Vitals Group     BP 11/15/22 0933 122/78     Pulse Rate 11/15/22 0933 79     Resp 11/15/22 0933 16     Temp 11/15/22 0933 98.5 F (36.9 C)     Temp Source  11/15/22 0933 Oral     SpO2 11/15/22 0933 93 %     Weight 11/15/22 0935 172 lb (78 kg)     Height 11/15/22 0935 5' 2"$  (1.575 m)     Head Circumference --      Peak Flow --      Pain Score 11/15/22 0934 0     Pain Loc --      Pain Edu? --      Excl. in [place]? --    No data found.  Updated Vital Signs BP 122/78 (BP Location: Left Arm)   Pulse 79   Temp 98.5 F (36.9 C) (Oral)   Resp 16   Ht 5' 2"$  (1.575 m)   Wt 78 kg   SpO2 93%   BMI 31.46 kg/m   Visual Acuity Right Eye Distance:   Left Eye Distance:   Bilateral Distance:    Right Eye Near:   Left Eye Near:    Bilateral Near:     Physical Exam GEN:     alert, non-toxic appearing elderly  female in no distress    HENT:  mucus membranes moist, oropharyngeal without lesions or exudate, no tonsillar hypertrophy, mild oropharyngeal erythema, moderate erythematous edematous turbinates, clear nasal discharge, bilateral TM normal EYES:   pupils equal and reactive, no scleral injection or discharge NECK:  ROM baseline   RESP:  no increased work of breathing, coarse breathe sounds bilaterally  CVS:   regular rate and rhythm Skin:   warm and dry    UC Treatments / Results  Labs (all labs ordered are listed, but only abnormal results are displayed) Labs Reviewed  GROUP A STREP BY PCR  SARS CORONAVIRUS 2 BY RT PCR    EKG   Radiology DG Chest 2 View  Result Date: 11/15/2022 CLINICAL DATA:  Cough for 5 days. EXAM: CHEST - 2 VIEW COMPARISON:  None Available. FINDINGS: The heart size and mediastinal contours are within normal limits. Aortic atherosclerotic calcification incidentally noted. Both lungs are clear. Moderate size hiatal hernia is seen. IMPRESSION: No active cardiopulmonary disease. Moderate hiatal hernia. Electronically Signed   By: Marlaine Hind M.D.   On: 11/15/2022 11:14    Procedures Procedures (including critical care time)  Medications Ordered in UC Medications - No data to display  Initial Impression /  Assessment and Plan / UC Course  I have reviewed the triage vital signs and the nursing notes.  Pertinent labs & imaging results that were available during my care of the patient were reviewed by me and considered in my medical decision making (see chart for details).       Pt is a 82 y.o. female who presents for 4 days of respiratory symptoms. Chastelyn is afebrile here without recent antipyretics. Satting well on room air. Overall pt is non-toxic  appearing, well hydrated, without respiratory distress. Pulmonary exam is remarkable for coarse breath sounds.  COVID obtained and was negative. Strep PCR negative. Patient concerned for possible pneumonia. Chest xray personally reviewed by me without focal pneumonia, pleural effusion, cardiomegaly or pneumothorax. Radiologist notes hiatal hernia.   History consistent with viral respiratory illness. Discussed symptomatic treatment.  Explained lack of efficacy of antibiotics in viral disease.  Typical duration of symptoms discussed.   Return and ED precautions given and voiced understanding. Discussed MDM, treatment plan and plan for follow-up with patient who agrees with plan.     Final Clinical Impressions(s) / UC Diagnoses   Final diagnoses:  Viral respiratory illness  Acute cough     Discharge Instructions      Your COVID and strep tests were negative.  Your chest xray didn't have evidence of pneumonia. I suspect your have a viral respiratory infection. I sent a nasal spray to help with congestion and cough syrup to your pharmacy. Stop by the pharmacy to pick them up.   You can take Tylenol and/or Ibuprofen as needed for fever reduction and pain relief.    For cough: honey 1/2 to 1 teaspoon (you can dilute the honey in water or another fluid).  You can also use guaifenesin and dextromethorphan for cough. You can use a humidifier for chest congestion and cough.  If you don't have a humidifier, you can sit in the bathroom with the hot  shower running.      For sore throat: try warm salt water gargles, Mucinex sore throat cough drops or cepacol lozenges, throat spray, warm tea or water with lemon/honey, popsicles or ice, or OTC cold relief medicine for throat discomfort. You can also purchase chloraseptic spray at the pharmacy or dollar store.   For congestion: take a daily anti-histamine like Zyrtec, Claritin, and a oral decongestant, such as pseudoephedrine.  You can also use Flonase 1-2 sprays in each nostril daily. Afrin is also a good option, if you do not have high blood pressure.    It is important to stay hydrated: drink plenty of fluids (water, gatorade/powerade/pedialyte, juices, or teas) to keep your throat moisturized and help further relieve irritation/discomfort.    Return or go to the Emergency Department if symptoms worsen or do not improve in the next few days      ED Prescriptions     Medication Sig Dispense Auth. Provider   promethazine-dextromethorphan (PROMETHAZINE-DM) 6.25-15 MG/5ML syrup Take 2.5 mLs by mouth 4 (four) times daily as needed. 118 mL Kaleya Douse, DO   ipratropium (ATROVENT) 0.06 % nasal spray Place 2 sprays into both nostrils 4 (four) times daily. 15 mL Lyndee Hensen, DO      PDMP not reviewed this encounter.   Lyndee Hensen, DO 11/15/22 1225

## 2022-11-15 NOTE — Discharge Instructions (Addendum)
Your COVID and strep tests were negative.  Your chest xray didn't have evidence of pneumonia. I suspect your have a viral respiratory infection. I sent a nasal spray to help with congestion and cough syrup to your pharmacy. Stop by the pharmacy to pick them up.   You can take Tylenol and/or Ibuprofen as needed for fever reduction and pain relief.    For cough: honey 1/2 to 1 teaspoon (you can dilute the honey in water or another fluid).  You can also use guaifenesin and dextromethorphan for cough. You can use a humidifier for chest congestion and cough.  If you don't have a humidifier, you can sit in the bathroom with the hot shower running.      For sore throat: try warm salt water gargles, Mucinex sore throat cough drops or cepacol lozenges, throat spray, warm tea or water with lemon/honey, popsicles or ice, or OTC cold relief medicine for throat discomfort. You can also purchase chloraseptic spray at the pharmacy or dollar store.   For congestion: take a daily anti-histamine like Zyrtec, Claritin, and a oral decongestant, such as pseudoephedrine.  You can also use Flonase 1-2 sprays in each nostril daily. Afrin is also a good option, if you do not have high blood pressure.    It is important to stay hydrated: drink plenty of fluids (water, gatorade/powerade/pedialyte, juices, or teas) to keep your throat moisturized and help further relieve irritation/discomfort.    Return or go to the Emergency Department if symptoms worsen or do not improve in the next few days

## 2022-11-15 NOTE — ED Triage Notes (Signed)
Pt presents with congestion and ears itching, scratchy throat and cough x day 5.

## 2023-05-26 ENCOUNTER — Ambulatory Visit
Admission: EM | Admit: 2023-05-26 | Discharge: 2023-05-26 | Disposition: A | Discharge status: Home / Self Care | Payer: Medicare PPO | Attending: Emergency Medicine | Admitting: Emergency Medicine

## 2023-05-26 ENCOUNTER — Other Ambulatory Visit: Payer: Self-pay

## 2023-05-26 DIAGNOSIS — K047 Periapical abscess without sinus: Secondary | ICD-10-CM | POA: Diagnosis not present

## 2023-05-26 DIAGNOSIS — K0889 Other specified disorders of teeth and supporting structures: Secondary | ICD-10-CM | POA: Diagnosis not present

## 2023-05-26 MED ORDER — CLINDAMYCIN HCL 150 MG PO CAPS: 450.0000 mg | ORAL_CAPSULE | Freq: Three times a day (TID) | ORAL | 0 refills | Status: AC

## 2023-05-26 NOTE — ED Triage Notes (Addendum)
Right ear pressure and jaw soreness x 3- 4 days

## 2023-05-26 NOTE — ED Provider Notes (Signed)
[facility]    CSN: [id number] Arrival date & time: 05/26/23  1242      History   Chief Complaint Chief Complaint  Patient presents with   right ear heaviness    HPI Sarah Barrera is a 82 y.o. female.   82 year old female, Sarah Barrera, presents to urgent care for evaluation of right-sided ear pain,jaw soreness, dental issue.  Patient states she is supposed to have a dental procedure in 2 days and her right ear is hurting her.   Patient denies any chest pain, palpitations, or shortness of breath.  Denies any trauma or injury  The history is provided by the patient. No language interpreter was used.    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    Past Surgical History:  Procedure Laterality Date   COLONOSCOPY WITH PROPOFOL N/A 07/23/2020   Procedure: COLONOSCOPY WITH PROPOFOL;  Surgeon: Midge Minium, MD;  Location: [facility];  Service: Endoscopy;  Laterality: N/A;   DILATION AND CURETTAGE OF UTERUS     several during 1980s   ESOPHAGOGASTRODUODENOSCOPY (EGD) WITH PROPOFOL N/A 07/23/2020   Procedure: ESOPHAGOGASTRODUODENOSCOPY (EGD) WITH PROPOFOL;  Surgeon: Midge Minium, MD;  Location: [facility];  Service: Endoscopy;  Laterality: N/A;   GIVENS CAPSULE STUDY N/A 09/18/2020   Procedure: GIVENS CAPSULE STUDY;  Surgeon: Midge Minium, MD;  Location: [facility] END[facility];  Service: Endoscopy;  Laterality: N/A;   WISDOM TOOTH EXTRACTION  1965    OB History   No obstetric history on file.      Home Medications    Prior to Admission medications   Medication Sig Start Date End Date  Taking? Authorizing Provider  clindamycin (CLEOCIN) 150 MG capsule Take 3 capsules (450 mg total) by mouth 3 (three) times daily for 7 days. 05/26/23 06/02/23 Yes Milee Qualls, Para March, NP  escitalopram (LEXAPRO) 10 MG tablet  06/13/20  Yes [provider]  Magnesium 250 MG TABS Take by mouth daily.   Yes [provider]  omeprazole (PRILOSEC) 40 MG capsule  06/13/20  Yes [provider]  oxybutynin (DITROPAN) 5 MG tablet oxybutynin chloride 5 mg tablet 06/14/18  Yes [provider]  Probiotic Product (PROBIOTIC PO) Take by mouth.   Yes [provider]  SYNTHROID 100 MCG tablet  04/20/20  Yes [provider]  Vitamin D, Ergocalciferol, (DRISDOL) 1.25 MG (50000 UNIT) CAPS capsule  06/13/20  Yes [provider]  cyclobenzaprine (FLEXERIL) 10 MG tablet cyclobenzaprine 10 mg tablet  Take 1 tablet every day by oral route as needed. Patient not taking: No sig reported    [provider]  ipratropium (ATROVENT) 0.06 % nasal spray Place 2 sprays into both nostrils 4 (four) times daily. 11/15/22   Katha Cabal, DO  meloxicam (MOBIC) 15 MG tablet meloxicam 15 mg tablet Patient not taking: No sig reported    [provider]  Na Sulfate-K Sulfate-Mg Sulf (SUPREP BOWEL PREP KIT) 17.5-3.13-1.6 GM/177ML SOLN Take 1 kit by mouth as directed. 07/18/20   Midge Minium, MD  promethazine-dextromethorphan (PROMETHAZINE-DM) 6.25-15 MG/5ML syrup Take 2.5 mLs by mouth 4 (four) times  daily as needed. 11/15/22   Katha Cabal, DO  rosuvastatin (CRESTOR) 20 MG tablet  04/20/20   [provider]    Family History History reviewed. No pertinent family history.  Social History Social History   Tobacco Use   Smoking status: Never   Smokeless tobacco: Never  Vaping Use   Vaping status: Never Used  Substance Use Topics   Alcohol use: Not Currently   Drug use: Never     Allergies   Venlafaxine and Penicillin g   Review of  Systems Review of Systems  Constitutional:  Negative for fever.  HENT:  Positive for dental problem and ear pain. Negative for facial swelling.   All other systems reviewed and are negative.    Physical Exam Triage Vital Signs ED Triage Vitals  Encounter Vitals Group     BP 05/26/23 1334 129/72     Systolic BP Percentile --      Diastolic BP Percentile --      Pulse Rate 05/26/23 1334 76     Resp 05/26/23 1334 20     Temp 05/26/23 1334 98.5 F (36.9 C)     Temp src --      SpO2 05/26/23 1334 100 %     Weight --      Height --      Head Circumference --      Peak Flow --      Pain Score 05/26/23 1332 0     Pain Loc --      Pain Education --      Exclude from Growth Chart --    No data found.  Updated Vital Signs BP 129/72   Pulse 76   Temp 98.5 F (36.9 C)   Resp 20   SpO2 100%   Visual Acuity Right Eye Distance:   Left Eye Distance:   Bilateral Distance:    Right Eye Near:   Left Eye Near:    Bilateral Near:     Physical Exam Vitals and nursing note reviewed.  HENT:     Head: Normocephalic.     Jaw: No trismus.      Right Ear: Tympanic membrane normal.     Left Ear: Tympanic membrane normal.     Nose: Nose normal.     Mouth/Throat:     Lips: Pink.     Mouth: Mucous membranes are moist.     Dentition: Abnormal dentition. Gingival swelling present.   Cardiovascular:     Rate and Rhythm: Normal rate.  Pulmonary:     Effort: Pulmonary effort is normal.  Musculoskeletal:     Cervical back: Normal range of motion and neck supple.  Lymphadenopathy:     Cervical: No cervical adenopathy.  Neurological:     General: No focal deficit present.     Mental Status: She is alert and oriented to person, place, and time.     GCS: GCS eye subscore is 4. GCS verbal subscore is 5. GCS motor subscore is 6.     Cranial Nerves: No cranial nerve deficit.     Sensory: No sensory deficit.  Psychiatric:        Attention and Perception: Attention normal.        Mood  and Affect: Mood normal.        Speech: Speech normal.        Behavior: Behavior normal.      UC Treatments / Results  Labs (all labs ordered are listed, but only abnormal results  are displayed) Labs Reviewed - No data to display  EKG   Radiology No results found.  Procedures Procedures (including critical care time)  Medications Ordered in UC Medications - No data to display  Initial Impression / Assessment and Plan / UC Course  I have reviewed the triage vital signs and the nursing notes.  Pertinent labs & imaging results that were available during my care of the patient were reviewed by me and considered in my medical decision making (see chart for details).    Pt placed on clindamycin due to pcn allergy. Discussed exam findings and plan of care with patient, strict go to ER precautions given.   Patient verbalized understanding to this provider.  Ddx: Dental pain,infection, right otalgia Final Clinical Impressions(s) / UC Diagnoses   Final diagnoses:  Pain, dental  Dental infection     Discharge Instructions      [facility] treating you with clindamycin due to your allergy to Penicillin. Take antibiotic as directed. Drink plenty of water, antibiotics can cause stomach upset/diarrhea. Keep dental appt in 2 days. If you develop chest pain,shortness of breath, vomiting or worsening issues go to ER for further evaluation.      ED Prescriptions     Medication Sig Dispense Auth. Provider   clindamycin (CLEOCIN) 150 MG capsule Take 3 capsules (450 mg total) by mouth 3 (three) times daily for 7 days. 63 capsule Javyn Havlin, Para March, NP      PDMP not reviewed this encounter.   Clancy Gourd, NP 05/26/23 2029

## 2023-05-26 NOTE — Discharge Instructions (Addendum)
[facility] treating you with clindamycin due to your allergy to Penicillin. Take antibiotic as directed. Drink plenty of water, antibiotics can cause stomach upset/diarrhea. Keep dental appt in 2 days. If you develop chest pain,shortness of breath, vomiting or worsening issues go to ER for further evaluation.

## 2023-10-14 NOTE — Anesthesia Preprocedure Evaluation (Addendum)
-------------------------------------------------------------------------------   Summary: 82yoF PMHx acute black stool, hypothyroidism, depression for EGD -------------------------------------------------------------------------------  Procedure Information:  Date/Time: 10/14/23 0945   Procedure: UGI ENDOSCOPY; WITH BIOPSY, SINGLE OR MULTIPLE - GENERIC EGD PROPOFOL  SENT INST.TO [facility]-10-17 G3 DONE 11/4- [facility]/8 at [facility] due to [facility] being busy, declined  11/11 due to being too early time for ride, wants to be at [facility] only but  its okay w/ last min openings 01-03-SHE'S COMING   Anesthesia type: General   Pre-op diagnosis: Black stool [K92.1]   [facility]   Surgeons: Conny Dorn Agent, MD     Anesthesia Evaluation     No history of anesthetic complications No family history of anesthetic complications  Airway  Mallampati: II TM distance: >3 FBneck circumference NOT greater than 40 cm Jaw Protrusion: normal Upper Lip Bite Test Class: I above vermillion line Neck ROM: full Mouth Opening: normal   Dental - normal exam     Pulmonary - negative ROS and normal exam   breath sounds clear to auscultation (-) rhonchi, decreased breath sounds, wheezes  Cardiovascular - negative ROS and normal exam Exercise tolerance: good (-) murmur  Rhythm: regular Rate: normal   Neuro/Psych   (+) psychiatric history and depression  GI/Hepatic/Renal - negative ROS    Endo/Other   (+) hypothyroidism, blood dyscrasia and anemia   Comments: Anemia H/H 10.9/30.9 (per 07/21/23 labs)  Abdominal  - normal exam  OB/GYN      HEENT - negative ROS                      Anesthesia Plan  ASA 2   NPO Appropriate? Yes  Anesthetic:  General and TIVA  Standard lines and monitors.  Type of induction: IV Airway: native airway                                          Post Procedure Pain  Management:oral pain medication and IV analgesics.    Anesthesia plan and risk discussed with patient; informed consent obtained.     Plan discussed with anesthesiologist. Serial Consent:no serial consent Date obtained:   Relevant Problems  No relevant active problems

## 2024-04-19 ENCOUNTER — Encounter: Payer: Self-pay | Admitting: Anesthesiology

## 2024-04-19 ENCOUNTER — Encounter: Payer: Self-pay | Admitting: Ophthalmology

## 2024-04-27 ENCOUNTER — Ambulatory Visit: Admission: RE | Admit: 2024-04-27 | Source: Home / Self Care | Admitting: Ophthalmology

## 2024-04-27 HISTORY — DX: Chronic kidney disease, stage 3a: N18.31

## 2024-04-27 SURGERY — PHACOEMULSIFICATION, CATARACT, WITH IOL INSERTION
Anesthesia: Topical | Laterality: Right

## 2024-06-30 ENCOUNTER — Encounter: Payer: Self-pay | Admitting: Ophthalmology

## 2024-07-01 ENCOUNTER — Encounter: Payer: Self-pay | Admitting: Ophthalmology

## 2024-07-04 NOTE — Discharge Instructions (Signed)

## 2024-07-05 NOTE — Anesthesia Preprocedure Evaluation (Addendum)
 Anesthesia Evaluation  Patient identified by MRN, date of birth, ID band Patient awake    Reviewed: Allergy & Precautions, H&P , NPO status , Patient's Chart, lab work & pertinent test results  Airway Mallampati: II  TM Distance: <3 FB Neck ROM: Full   Comment: Very short TMD  Dental no notable dental hx.    Pulmonary neg pulmonary ROS   Pulmonary exam normal breath sounds clear to auscultation       Cardiovascular negative cardio ROS Normal cardiovascular exam Rhythm:Regular Rate:Normal     Neuro/Psych negative neurological ROS  negative psych ROS   GI/Hepatic negative GI ROS, Neg liver ROS,,,  Endo/Other  negative endocrine ROS    Renal/GU negative Renal ROS  negative genitourinary   Musculoskeletal negative musculoskeletal ROS (+)    Abdominal   Peds negative pediatric ROS (+)  Hematology negative hematology ROS (+)   Anesthesia Other Findings Hypothyroidism  GERD (gastroesophageal reflux disease) Hyperlipidemia  Stage 3a chronic kidney disease (CKD) Arthritis  Depression    Reproductive/Obstetrics negative OB ROS                              Anesthesia Physical Anesthesia Plan  ASA: 2  Anesthesia Plan: MAC   Post-op Pain Management:    Induction: Intravenous  PONV Risk Score and Plan:   Airway Management Planned: Natural Airway and Nasal Cannula  Additional Equipment:   Intra-op Plan:   Post-operative Plan:   Informed Consent: I have reviewed the patients History and Physical, chart, labs and discussed the procedure including the risks, benefits and alternatives for the proposed anesthesia with the patient or authorized representative who has indicated his/her understanding and acceptance.     Dental Advisory Given  Plan Discussed with: Anesthesiologist, CRNA and Surgeon  Anesthesia Plan Comments: (Patient consented for risks of anesthesia including but  not limited to:  - adverse reactions to medications - damage to eyes, teeth, lips or other oral mucosa - nerve damage due to positioning  - sore throat or hoarseness - Damage to heart, brain, nerves, lungs, other parts of body or loss of life  Patient voiced understanding and assent.)        Anesthesia Quick Evaluation

## 2024-07-06 ENCOUNTER — Encounter: Payer: Self-pay | Admitting: Ophthalmology

## 2024-07-06 ENCOUNTER — Ambulatory Visit: Payer: Self-pay | Admitting: Anesthesiology

## 2024-07-06 ENCOUNTER — Other Ambulatory Visit: Payer: Self-pay

## 2024-07-06 ENCOUNTER — Encounter
Admission: RE | Disposition: A | Discharge status: Home / Self Care | Payer: Self-pay | Source: Home / Self Care | Attending: Ophthalmology

## 2024-07-06 ENCOUNTER — Ambulatory Visit
Admission: RE | Admit: 2024-07-06 | Discharge: 2024-07-06 | Disposition: A | Discharge status: Home / Self Care | Attending: Ophthalmology | Admitting: Ophthalmology

## 2024-07-06 DIAGNOSIS — H2511 Age-related nuclear cataract, right eye: Secondary | ICD-10-CM | POA: Diagnosis present

## 2024-07-06 HISTORY — PX: CATARACT EXTRACTION W/PHACO: SHX586

## 2024-07-06 HISTORY — DX: Unspecified osteoarthritis, unspecified site: M19.90

## 2024-07-06 HISTORY — DX: Depression, unspecified: F32.A

## 2024-07-06 SURGERY — PHACOEMULSIFICATION, CATARACT, WITH IOL INSERTION
Anesthesia: Monitor Anesthesia Care | Site: Eye | Laterality: Right

## 2024-07-06 MED ORDER — MIDAZOLAM HCL 2 MG/2ML IJ SOLN
INTRAMUSCULAR | Status: DC | PRN
  Administered 2024-07-06: .5 mg via INTRAVENOUS

## 2024-07-06 MED ORDER — ARMC OPHTHALMIC DILATING DROPS
1.0000 | OPHTHALMIC | Status: AC | PRN
  Administered 2024-07-06 (×3): 1 via OPHTHALMIC

## 2024-07-06 MED ORDER — SIGHTPATH DOSE#1 NA HYALUR & NA CHOND-NA HYALUR IO KIT
PACK | INTRAOCULAR | Status: DC | PRN
  Administered 2024-07-06: 1 via OPHTHALMIC

## 2024-07-06 MED ORDER — ARMC OPHTHALMIC DILATING DROPS
OPHTHALMIC | Status: AC
  Filled 2024-07-06: qty 0.5

## 2024-07-06 MED ORDER — SIGHTPATH DOSE#1 BSS IO SOLN
INTRAOCULAR | Status: DC | PRN
  Administered 2024-07-06: 15 mL via INTRAOCULAR

## 2024-07-06 MED ORDER — BRIMONIDINE TARTRATE-TIMOLOL 0.2-0.5 % OP SOLN
OPHTHALMIC | Status: DC | PRN
  Administered 2024-07-06: 1 [drp] via OPHTHALMIC

## 2024-07-06 MED ORDER — MOXIFLOXACIN HCL 0.5 % OP SOLN
OPHTHALMIC | Status: DC | PRN
  Administered 2024-07-06: .2 mL via OPHTHALMIC

## 2024-07-06 MED ORDER — FENTANYL CITRATE (PF) 100 MCG/2ML IJ SOLN
INTRAMUSCULAR | Status: AC
  Filled 2024-07-06: qty 2

## 2024-07-06 MED ORDER — SIGHTPATH DOSE#1 BSS IO SOLN
INTRAOCULAR | Status: DC | PRN
  Administered 2024-07-06: 60 mL via OPHTHALMIC

## 2024-07-06 MED ORDER — TETRACAINE HCL 0.5 % OP SOLN
1.0000 [drp] | OPHTHALMIC | Status: AC | PRN
  Administered 2024-07-06 (×3): 1 [drp] via OPHTHALMIC

## 2024-07-06 MED ORDER — LACTATED RINGERS IV SOLN: INTRAVENOUS | Status: DC

## 2024-07-06 MED ORDER — TETRACAINE HCL 0.5 % OP SOLN
OPHTHALMIC | Status: AC
  Filled 2024-07-06: qty 4

## 2024-07-06 MED ORDER — MIDAZOLAM HCL 2 MG/2ML IJ SOLN
INTRAMUSCULAR | Status: AC
  Filled 2024-07-06: qty 2

## 2024-07-06 MED ORDER — LIDOCAINE HCL (PF) 2 % IJ SOLN
INTRAOCULAR | Status: DC | PRN
  Administered 2024-07-06: 2 mL

## 2024-07-06 MED ORDER — FENTANYL CITRATE (PF) 100 MCG/2ML IJ SOLN
INTRAMUSCULAR | Status: DC | PRN
  Administered 2024-07-06: 50 ug via INTRAVENOUS

## 2024-07-06 SURGICAL SUPPLY — 8 items
FEE CATARACT SUITE SIGHTPATH (MISCELLANEOUS) ×1 IMPLANT
GLOVE BIOGEL PI IND STRL 8 (GLOVE) ×1 IMPLANT
GLOVE SURG LX STRL 7.5 STRW (GLOVE) ×1 IMPLANT
GLOVE SURG SYN 6.5 PF PI BL (GLOVE) ×1 IMPLANT
LENS IOL TECNIS EYHANCE 19.5 (Intraocular Lens) IMPLANT
NDL FILTER BLUNT 18X1 1/2 (NEEDLE) ×1 IMPLANT
NEEDLE FILTER BLUNT 18X1 1/2 (NEEDLE) ×1 IMPLANT
SYR 3ML LL SCALE MARK (SYRINGE) ×1 IMPLANT

## 2024-07-06 NOTE — Transfer of Care (Signed)
 Immediate Anesthesia Transfer of Care Note  Patient: Sarah Barrera  Procedure(s) Performed: PHACOEMULSIFICATION, CATARACT, WITH IOL INSERTION 8.67 00:44.0 (Right: Eye)  Patient Location: PACU  Anesthesia Type: MAC  Level of Consciousness: awake, alert  and patient cooperative  Airway and Oxygen Therapy: Patient Spontanous Breathing   Post-op Assessment: Post-op Vital signs reviewed, Patient's Cardiovascular Status Stable, Respiratory Function Stable, Patent Airway and No signs of Nausea or vomiting  Post-op Vital Signs: Reviewed and stable  Complications: No notable events documented.

## 2024-07-06 NOTE — Anesthesia Postprocedure Evaluation (Signed)
 Anesthesia Post Note  Patient: Sarah Barrera  Procedure(s) Performed: PHACOEMULSIFICATION, CATARACT, WITH IOL INSERTION 8.67 00:44.0 (Right: Eye)  Patient location during evaluation: PACU Anesthesia Type: MAC Level of consciousness: awake and alert Pain management: pain level controlled Vital Signs Assessment: post-procedure vital signs reviewed and stable Respiratory status: spontaneous breathing, nonlabored ventilation, respiratory function stable and patient connected to nasal cannula oxygen Cardiovascular status: stable and blood pressure returned to baseline Postop Assessment: no apparent nausea or vomiting Anesthetic complications: no   No notable events documented.   Last Vitals:  Vitals:   07/06/24 1251 07/06/24 1255  BP: 111/70 111/64  Pulse: 68 67  Resp: 12 11  Temp: (!) 36.1 C (!) 36.2 C  SpO2: 95% 94%    Last Pain:  Vitals:   07/06/24 1255  TempSrc:   PainSc: 0-No pain                 Philena Obey C Jaliah Foody

## 2024-07-06 NOTE — Op Note (Signed)
 [facility]   Primary Care Physician:  [facility] Primary Ophthalmologist: Dr. Dene Etienne  Pre-Procedure History & Physical: HPI:  Sarah Barrera is a 83 y.o. female here for ophthalmic surgery.   Past Medical History:  Diagnosis Date   Arthritis    Depression    GERD (gastroesophageal reflux disease)    Hyperlipidemia    Hypothyroidism    Stage 3a chronic kidney disease (CKD) (HCC)     Past Surgical History:  Procedure Laterality Date   COLONOSCOPY WITH PROPOFOL  N/A 07/23/2020   Procedure: COLONOSCOPY WITH PROPOFOL ;  Surgeon: Jinny Carmine, MD;  Location: [facility];  Service: Endoscopy;  Laterality: N/A;   DILATION AND CURETTAGE OF UTERUS     several during 1980s   ESOPHAGOGASTRODUODENOSCOPY (EGD) WITH PROPOFOL  N/A 07/23/2020   Procedure: ESOPHAGOGASTRODUODENOSCOPY (EGD) WITH PROPOFOL ;  Surgeon: Jinny Carmine, MD;  Location: [facility];  Service: Endoscopy;  Laterality: N/A;   GIVENS CAPSULE STUDY N/A 09/18/2020   Procedure: GIVENS CAPSULE STUDY;  Surgeon: Jinny Carmine, MD;  Location: [facility] ENDOSCOPY;  Service: Endoscopy;  Laterality: N/A;   WISDOM TOOTH EXTRACTION  1965    Prior to Admission medications   Medication Sig Start Date End Date Taking? Authorizing Provider  Calcium 200 MG TABS Take 1 tablet by mouth daily.   Yes [provider]  diphenhydrAMINE (BENADRYL) 25 mg capsule Take 25 mg by mouth every 6 (six) hours as needed.   Yes [provider]  escitalopram (LEXAPRO) 10 MG tablet  06/13/20  Yes [provider]  Magnesium 250 MG TABS Take by mouth daily.   Yes [provider]  naproxen sodium (ALEVE) 220 MG tablet Take 220 mg by mouth as needed.   Yes [provider]  Nutritional Supplements (JUICE PLUS FIBRE PO) Take 1 tablet by mouth daily.   Yes [provider]  omeprazole (PRILOSEC) 40 MG capsule  06/13/20  Yes [provider]  oxybutynin (DITROPAN) 5 MG tablet oxybutynin  chloride 5 mg tablet 06/14/18  Yes [provider]  rosuvastatin (CRESTOR) 20 MG tablet  04/20/20  Yes [provider]  SYNTHROID 100 MCG tablet  04/20/20  Yes [provider]  traMADol (ULTRAM) 50 MG tablet Take 50 mg by mouth every 6 (six) hours as needed.   Yes [provider]  Vitamin D, Ergocalciferol, (DRISDOL) 1.25 MG (50000 UNIT) CAPS capsule  06/13/20  Yes [provider]    Allergies as of 04/25/2024 - Review Complete 04/14/2024  Allergen Reaction Noted   Venlafaxine  07/20/2014   Penicillin g Itching and Rash 07/20/2014    History reviewed. No pertinent family history.  Social History   Socioeconomic History   Marital status: Single    Spouse name: Not on file   Number of children: Not on file   Years of education: Not on file   Highest education level: Not on file  Occupational History   Not on file  Tobacco Use   Smoking status: Never   Smokeless tobacco: Never  Vaping Use   Vaping status: Never Used  Substance and Sexual Activity   Alcohol use: Not Currently   Drug use: Never   Sexual activity: Not Currently  Other Topics Concern   Not on file  Social History Narrative   Not on file   Social Drivers of Health   Financial Resource Strain: Low Risk  (12/08/2023)   Received from [facility]   Overall Financial Resource Strain (CARDIA)  Difficulty of Paying Living Expenses: Not hard at all  Food Insecurity: No Food Insecurity (12/08/2023)   Received from [facility]   Hunger Vital Sign    Within the past 12 months, you worried that your food would run out before you got the money to buy more.: Never true    Within the past 12 months, the food you bought just didn't last and you didn't have money to get more.: Never true  Transportation Needs: No Transportation Needs (12/08/2023)   Received from [facility] - Transportation    In the past 12 months, has lack  of transportation kept you from medical appointments or from getting medications?: No    Lack of Transportation (Non-Medical): No  Physical Activity: Not on file  Stress: No Stress Concern Present (07/21/2023)   Received from [facility] - Occupational Stress Questionnaire    Feeling of Stress : Not at all  Social Connections: Not on file  Intimate Partner Violence: Not At Risk (07/21/2023)   Received from [facility]   Humiliation, Afraid, Rape, and Kick questionnaire    Within the last year, have you been afraid of your partner or ex-partner?: No    Within the last year, have you been humiliated or emotionally abused in other ways by your partner or ex-partner?: No    Within the last year, have you been kicked, hit, slapped, or otherwise physically hurt by your partner or ex-partner?: No    Within the last year, have you been raped or forced to have any kind of sexual activity by your partner or ex-partner?: No    Review of Systems: See HPI, otherwise negative ROS  Physical Exam: BP 139/75   Pulse 72   Temp (!) 97.4 F (36.3 C) (Temporal)   Ht 5' 1 (1.549 m)   Wt 77 kg   SpO2 97%   BMI 32.07 kg/m  General:   Alert,  pleasant and cooperative in NAD Head:  Normocephalic and atraumatic. Lungs:  Clear to auscultation.    Heart:  Regular rate and rhythm.   Impression/Plan: Sarah Barrera is here for ophthalmic surgery.  Risks, benefits, limitations, and alternatives regarding ophthalmic surgery have been reviewed with the patient.  Questions have been answered.  All parties agreeable.   Sarah GASKIN, MD  07/06/2024, 12:49 PM

## 2024-07-06 NOTE — H&P (Signed)
 [facility]   Primary Care Physician:  [facility] Primary Ophthalmologist: Dr. Dene Etienne  Pre-Procedure History & Physical: HPI:  Sarah Barrera is a 83 y.o. female here for ophthalmic surgery.   Past Medical History:  Diagnosis Date   Arthritis    Depression    GERD (gastroesophageal reflux disease)    Hyperlipidemia    Hypothyroidism    Stage 3a chronic kidney disease (CKD) (HCC)     Past Surgical History:  Procedure Laterality Date   COLONOSCOPY WITH PROPOFOL  N/A 07/23/2020   Procedure: COLONOSCOPY WITH PROPOFOL ;  Surgeon: Jinny Carmine, MD;  Location: [facility];  Service: Endoscopy;  Laterality: N/A;   DILATION AND CURETTAGE OF UTERUS     several during 1980s   ESOPHAGOGASTRODUODENOSCOPY (EGD) WITH PROPOFOL  N/A 07/23/2020   Procedure: ESOPHAGOGASTRODUODENOSCOPY (EGD) WITH PROPOFOL ;  Surgeon: Jinny Carmine, MD;  Location: [facility];  Service: Endoscopy;  Laterality: N/A;   GIVENS CAPSULE STUDY N/A 09/18/2020   Procedure: GIVENS CAPSULE STUDY;  Surgeon: Jinny Carmine, MD;  Location: [facility];  Service: Endoscopy;  Laterality: N/A;   WISDOM TOOTH EXTRACTION  1965    Prior to Admission medications   Medication Sig Start Date End Date Taking? Authorizing Provider  Calcium 200 MG TABS Take 1 tablet by mouth daily.   Yes [provider]  diphenhydrAMINE (BENADRYL) 25 mg capsule Take 25 mg by mouth every 6 (six) hours as needed.   Yes [provider]  escitalopram (LEXAPRO) 10 MG tablet  06/13/20  Yes [provider]  Magnesium 250 MG TABS Take by mouth daily.   Yes [provider]  naproxen sodium (ALEVE) 220 MG tablet Take 220 mg by mouth as needed.   Yes [provider]  Nutritional Supplements (JUICE PLUS FIBRE PO) Take 1 tablet by mouth daily.   Yes [provider]  omeprazole (PRILOSEC) 40 MG capsule  06/13/20  Yes [provider]  oxybutynin (DITROPAN) 5 MG tablet  oxybutynin chloride 5 mg tablet 06/14/18  Yes [provider]  rosuvastatin (CRESTOR) 20 MG tablet  04/20/20  Yes [provider]  SYNTHROID 100 MCG tablet  04/20/20  Yes [provider]  traMADol (ULTRAM) 50 MG tablet Take 50 mg by mouth every 6 (six) hours as needed.   Yes [provider]  Vitamin D, Ergocalciferol, (DRISDOL) 1.25 MG (50000 UNIT) CAPS capsule  06/13/20  Yes [provider]    Allergies as of 04/25/2024 - Review Complete 04/14/2024  Allergen Reaction Noted   Venlafaxine  07/20/2014   Penicillin g Itching and Rash 07/20/2014    History reviewed. No pertinent family history.  Social History   Socioeconomic History   Marital status: Single    Spouse name: Not on file   Number of children: Not on file   Years of education: Not on file   Highest education level: Not on file  Occupational History   Not on file  Tobacco Use   Smoking status: Never   Smokeless tobacco: Never  Vaping Use   Vaping status: Never Used  Substance and Sexual Activity   Alcohol use: Not Currently   Drug use: Never   Sexual activity: Not Currently  Other Topics Concern   Not on file  Social History Narrative   Not on file   Social Drivers of Health   Financial Resource Strain: Low Risk  (12/08/2023)   Received from [facility]   Overall Financial Resource Strain (CARDIA)  Difficulty of Paying Living Expenses: Not hard at all  Food Insecurity: No Food Insecurity (12/08/2023)   Received from [facility]   Hunger Vital Sign    Within the past 12 months, you worried that your food would run out before you got the money to buy more.: Never true    Within the past 12 months, the food you bought just didn't last and you didn't have money to get more.: Never true  Transportation Needs: No Transportation Needs (12/08/2023)   Received from [facility] - Transportation    In the past 12  months, has lack of transportation kept you from medical appointments or from getting medications?: No    Lack of Transportation (Non-Medical): No  Physical Activity: Not on file  Stress: No Stress Concern Present (07/21/2023)   Received from [facility] - Occupational Stress Questionnaire    Feeling of Stress : Not at all  Social Connections: Not on file  Intimate Partner Violence: Not At Risk (07/21/2023)   Received from [facility]   Humiliation, Afraid, Rape, and Kick questionnaire    Within the last year, have you been afraid of your partner or ex-partner?: No    Within the last year, have you been humiliated or emotionally abused in other ways by your partner or ex-partner?: No    Within the last year, have you been kicked, hit, slapped, or otherwise physically hurt by your partner or ex-partner?: No    Within the last year, have you been raped or forced to have any kind of sexual activity by your partner or ex-partner?: No    Review of Systems: See HPI, otherwise negative ROS  Physical Exam: BP 139/75   Pulse 72   Temp (!) 97.4 F (36.3 C) (Temporal)   Ht 5' 1 (1.549 m)   Wt 77 kg   SpO2 97%   BMI 32.07 kg/m  General:   Alert,  pleasant and cooperative in NAD Head:  Normocephalic and atraumatic. Lungs:  Clear to auscultation.    Heart:  Regular rate and rhythm.   Impression/Plan: Sarah Barrera is here for ophthalmic surgery.  Risks, benefits, limitations, and alternatives regarding ophthalmic surgery have been reviewed with the patient.  Questions have been answered.  All parties agreeable.   Sarah GASKIN, MD  07/06/2024, 12:01 PM

## 2024-07-07 NOTE — Op Note (Signed)
 LOCATION:  [facility]   PREOPERATIVE DIAGNOSIS:    Nuclear sclerotic cataract right eye. H25.11   POSTOPERATIVE DIAGNOSIS:  Nuclear sclerotic cataract right eye.     PROCEDURE:  Phacoemusification with posterior chamber intraocular lens placement of the right eye   ULTRASOUND TIME: Procedure(s): PHACOEMULSIFICATION, CATARACT, WITH IOL INSERTION 8.67 00:44.0 (Right)  LENS:   Implant Name Type Inv. Item Serial No. Manufacturer Lot No. LRB No. Used Action  LENS IOL TECNIS EYHANCE 19.5 - [id number] Intraocular Lens LENS IOL TECNIS EYHANCE 19.5 [id number] [employer]  Right 1 Implanted         SURGEON:  Dene FABIENE Etienne, MD   ANESTHESIA:  Topical with tetracaine drops and 2% Xylocaine  jelly, augmented with 1% preservative-free intracameral lidocaine .    COMPLICATIONS:  None.   DESCRIPTION OF PROCEDURE:  The patient was identified in the holding room and transported to the operating room and placed in the supine position under the operating microscope.  The right eye was identified as the operative eye and it was prepped and draped in the usual sterile ophthalmic fashion.   A 1 millimeter clear-corneal paracentesis was made at the 12:00 position.  0.5 ml of preservative-free 1% lidocaine  was injected into the anterior chamber. The anterior chamber was filled with Viscoat viscoelastic.  A 2.4 millimeter keratome was used to make a near-clear corneal incision at the 9:00 position.  A curvilinear capsulorrhexis was made with a cystotome and capsulorrhexis forceps.  Balanced salt solution was used to hydrodissect and hydrodelineate the nucleus.   Phacoemulsification was then used in stop and chop fashion to remove the lens nucleus and epinucleus.  The remaining cortex was then removed using the irrigation and aspiration handpiece. Provisc was then placed into the capsular bag to distend it for lens placement.  A lens was then injected into the capsular bag.  The remaining  viscoelastic was aspirated.   Wounds were hydrated with balanced salt solution.  The anterior chamber was inflated to a physiologic pressure with balanced salt solution.  No wound leaks were noted. Cefuroxime 0.1 ml of a 10mg /ml solution was injected into the anterior chamber for a dose of 1 mg of intracameral antibiotic at the completion of the case.   Timolol and Brimonidine drops were applied to the eye.  The patient was taken to the recovery room in stable condition without complications of anesthesia or surgery.   Sarah Barrera 07/07/2024, 8:23 AM

## 2024-08-15 ENCOUNTER — Encounter: Payer: Self-pay | Admitting: Ophthalmology

## 2024-08-15 NOTE — Anesthesia Preprocedure Evaluation (Signed)
 Anesthesia Evaluation  Patient identified by MRN, date of birth, ID band Patient awake    Reviewed: Allergy & Precautions, H&P , NPO status , Patient's Chart, lab work & pertinent test results  Airway Mallampati: II  TM Distance: <3 FB Neck ROM: Full    Dental no notable dental hx.    Pulmonary neg pulmonary ROS   Pulmonary exam normal breath sounds clear to auscultation       Cardiovascular negative cardio ROS Normal cardiovascular exam Rhythm:Regular Rate:Normal     Neuro/Psych  PSYCHIATRIC DISORDERS  Depression    negative neurological ROS  negative psych ROS   GI/Hepatic negative GI ROS, Neg liver ROS,GERD  ,,  Endo/Other  negative endocrine ROSHypothyroidism    Renal/GU Renal diseasenegative Renal ROS  negative genitourinary   Musculoskeletal negative musculoskeletal ROS (+) Arthritis ,    Abdominal   Peds negative pediatric ROS (+)  Hematology negative hematology ROS (+) Blood dyscrasia, anemia   Anesthesia Other Findings Previous cataract surg 07-06-24 Dr. Ola  Hypothyroidism             GERD (gastroesophageal reflux disease) Hyperlipidemia             Stage 3a chronic kidney disease (CKD) Arthritis             Depression   Reproductive/Obstetrics negative OB ROS                              Anesthesia Physical Anesthesia Plan  ASA: 2  Anesthesia Plan: MAC   Post-op Pain Management:    Induction: Intravenous  PONV Risk Score and Plan:   Airway Management Planned: Natural Airway and Nasal Cannula  Additional Equipment:   Intra-op Plan:   Post-operative Plan:   Informed Consent: I have reviewed the patients History and Physical, chart, labs and discussed the procedure including the risks, benefits and alternatives for the proposed anesthesia with the patient or authorized representative who has indicated his/her understanding and acceptance.     Dental  Advisory Given  Plan Discussed with: Anesthesiologist, CRNA and Surgeon  Anesthesia Plan Comments: (Patient consented for risks of anesthesia including but not limited to:  - adverse reactions to medications - damage to eyes, teeth, lips or other oral mucosa - nerve damage due to positioning  - sore throat or hoarseness - Damage to heart, brain, nerves, lungs, other parts of body or loss of life  Patient voiced understanding and assent.)         Anesthesia Quick Evaluation

## 2024-08-15 NOTE — Discharge Instructions (Signed)

## 2024-08-17 ENCOUNTER — Ambulatory Visit
Admission: RE | Admit: 2024-08-17 | Discharge: 2024-08-17 | Disposition: A | Discharge status: Home / Self Care | Attending: Ophthalmology | Admitting: Ophthalmology

## 2024-08-17 ENCOUNTER — Other Ambulatory Visit: Payer: Self-pay

## 2024-08-17 ENCOUNTER — Ambulatory Visit: Payer: Self-pay | Admitting: Anesthesiology

## 2024-08-17 ENCOUNTER — Encounter: Payer: Self-pay | Admitting: Ophthalmology

## 2024-08-17 ENCOUNTER — Encounter
Admission: RE | Disposition: A | Discharge status: Home / Self Care | Payer: Self-pay | Source: Home / Self Care | Attending: Ophthalmology

## 2024-08-17 DIAGNOSIS — N1831 Chronic kidney disease, stage 3a: Secondary | ICD-10-CM | POA: Insufficient documentation

## 2024-08-17 DIAGNOSIS — Z9841 Cataract extraction status, right eye: Secondary | ICD-10-CM | POA: Insufficient documentation

## 2024-08-17 DIAGNOSIS — D649 Anemia, unspecified: Secondary | ICD-10-CM | POA: Diagnosis not present

## 2024-08-17 DIAGNOSIS — Z961 Presence of intraocular lens: Secondary | ICD-10-CM | POA: Diagnosis not present

## 2024-08-17 DIAGNOSIS — E039 Hypothyroidism, unspecified: Secondary | ICD-10-CM | POA: Diagnosis not present

## 2024-08-17 DIAGNOSIS — E785 Hyperlipidemia, unspecified: Secondary | ICD-10-CM | POA: Insufficient documentation

## 2024-08-17 DIAGNOSIS — K219 Gastro-esophageal reflux disease without esophagitis: Secondary | ICD-10-CM | POA: Diagnosis not present

## 2024-08-17 DIAGNOSIS — M1990 Unspecified osteoarthritis, unspecified site: Secondary | ICD-10-CM | POA: Insufficient documentation

## 2024-08-17 DIAGNOSIS — H2512 Age-related nuclear cataract, left eye: Secondary | ICD-10-CM | POA: Insufficient documentation

## 2024-08-17 DIAGNOSIS — Z79890 Hormone replacement therapy (postmenopausal): Secondary | ICD-10-CM | POA: Insufficient documentation

## 2024-08-17 DIAGNOSIS — F32A Depression, unspecified: Secondary | ICD-10-CM | POA: Diagnosis not present

## 2024-08-17 HISTORY — PX: CATARACT EXTRACTION W/PHACO: SHX586

## 2024-08-17 SURGERY — PHACOEMULSIFICATION, CATARACT, WITH IOL INSERTION
Anesthesia: Monitor Anesthesia Care | Site: Eye | Laterality: Left

## 2024-08-17 MED ORDER — SIGHTPATH DOSE#1 BSS IO SOLN
INTRAOCULAR | Status: DC | PRN
  Administered 2024-08-17: 74 mL via OPHTHALMIC

## 2024-08-17 MED ORDER — MOXIFLOXACIN HCL 0.5 % OP SOLN
OPHTHALMIC | Status: DC | PRN
  Administered 2024-08-17: .2 mL via OPHTHALMIC

## 2024-08-17 MED ORDER — CYCLOPENTOLATE HCL 2 % OP SOLN
1.0000 [drp] | OPHTHALMIC | Status: AC
  Administered 2024-08-17 (×3): 1 [drp] via OPHTHALMIC

## 2024-08-17 MED ORDER — MIDAZOLAM HCL 2 MG/2ML IJ SOLN
INTRAMUSCULAR | Status: AC
  Filled 2024-08-17: qty 2

## 2024-08-17 MED ORDER — SIGHTPATH DOSE#1 BSS IO SOLN
INTRAOCULAR | Status: DC | PRN
  Administered 2024-08-17: 15 mL via INTRAOCULAR

## 2024-08-17 MED ORDER — BRIMONIDINE TARTRATE-TIMOLOL 0.2-0.5 % OP SOLN
OPHTHALMIC | Status: DC | PRN
  Administered 2024-08-17: 1 [drp] via OPHTHALMIC

## 2024-08-17 MED ORDER — FENTANYL CITRATE (PF) 100 MCG/2ML IJ SOLN
INTRAMUSCULAR | Status: DC | PRN
  Administered 2024-08-17: 50 ug via INTRAVENOUS

## 2024-08-17 MED ORDER — PHENYLEPHRINE HCL 10 % OP SOLN
1.0000 [drp] | OPHTHALMIC | Status: AC
  Administered 2024-08-17 (×3): 1 [drp] via OPHTHALMIC

## 2024-08-17 MED ORDER — FENTANYL CITRATE (PF) 100 MCG/2ML IJ SOLN
INTRAMUSCULAR | Status: AC
  Filled 2024-08-17: qty 2

## 2024-08-17 MED ORDER — MIDAZOLAM HCL (PF) 2 MG/2ML IJ SOLN
INTRAMUSCULAR | Status: DC | PRN
  Administered 2024-08-17: .5 mg via INTRAVENOUS

## 2024-08-17 MED ORDER — SIGHTPATH DOSE#1 NA HYALUR & NA CHOND-NA HYALUR IO KIT
PACK | INTRAOCULAR | Status: DC | PRN
  Administered 2024-08-17: 1 via OPHTHALMIC

## 2024-08-17 MED ORDER — LIDOCAINE HCL (PF) 2 % IJ SOLN
INTRAOCULAR | Status: DC | PRN
  Administered 2024-08-17: 2 mL

## 2024-08-17 MED ORDER — TETRACAINE HCL 0.5 % OP SOLN
1.0000 [drp] | OPHTHALMIC | Status: AC
  Administered 2024-08-17 (×3): 1 [drp] via OPHTHALMIC

## 2024-08-17 MED ORDER — LACTATED RINGERS IV SOLN: INTRAVENOUS | Status: DC

## 2024-08-17 SURGICAL SUPPLY — 8 items
FEE CATARACT SUITE SIGHTPATH (MISCELLANEOUS) ×1 IMPLANT
GLOVE BIOGEL PI IND STRL 8 (GLOVE) ×1 IMPLANT
GLOVE SURG LX STRL 7.5 STRW (GLOVE) ×1 IMPLANT
GLOVE SURG SYN 6.5 PF PI BL (GLOVE) ×1 IMPLANT
LENS IOL TECNIS EYHANCE 19.0 (Intraocular Lens) IMPLANT
NDL FILTER BLUNT 18X1 1/2 (NEEDLE) ×1 IMPLANT
NEEDLE FILTER BLUNT 18X1 1/2 (NEEDLE) ×1 IMPLANT
SYR 3ML LL SCALE MARK (SYRINGE) ×1 IMPLANT

## 2024-08-17 NOTE — Transfer of Care (Signed)
 Immediate Anesthesia Transfer of Care Note  Patient: Sarah Barrera  Procedure(s) Performed: PHACOEMULSIFICATION, CATARACT, WITH IOL INSERTION 8.15 00:37.9 (Left: Eye)  Patient Location: PACU  Anesthesia Type: MAC  Level of Consciousness: awake, alert  and patient cooperative  Airway and Oxygen Therapy: Patient Spontanous Breathing and Patient connected to supplemental oxygen  Post-op Assessment: Post-op Vital signs reviewed, Patient's Cardiovascular Status Stable, Respiratory Function Stable, Patent Airway and No signs of Nausea or vomiting  Post-op Vital Signs: Reviewed and stable  Complications: No notable events documented.

## 2024-08-17 NOTE — H&P (Signed)
 [facility]   Primary Care Physician:  [facility] Primary Ophthalmologist: Dr. Dene Etienne  Pre-Procedure History & Physical: HPI:  Sarah Barrera is a 83 y.o. female here for ophthalmic surgery.   Past Medical History:  Diagnosis Date   Arthritis    Depression    GERD (gastroesophageal reflux disease)    Hyperlipidemia    Hypothyroidism    Stage 3a chronic kidney disease (CKD) (HCC)     Past Surgical History:  Procedure Laterality Date   CATARACT EXTRACTION W/PHACO Right 07/06/2024   Procedure: PHACOEMULSIFICATION, CATARACT, WITH IOL INSERTION 8.67 00:44.0;  Surgeon: Etienne Dene, MD;  Location: [facility];  Service: Ophthalmology;  Laterality: Right;   COLONOSCOPY WITH PROPOFOL  N/A 07/23/2020   Procedure: COLONOSCOPY WITH PROPOFOL ;  Surgeon: Jinny Carmine, MD;  Location: [facility];  Service: Endoscopy;  Laterality: N/A;   DILATION AND CURETTAGE OF UTERUS     several during 1980s   ESOPHAGOGASTRODUODENOSCOPY (EGD) WITH PROPOFOL  N/A 07/23/2020   Procedure: ESOPHAGOGASTRODUODENOSCOPY (EGD) WITH PROPOFOL ;  Surgeon: Jinny Carmine, MD;  Location: [facility];  Service: Endoscopy;  Laterality: N/A;   GIVENS CAPSULE STUDY N/A 09/18/2020   Procedure: GIVENS CAPSULE STUDY;  Surgeon: Jinny Carmine, MD;  Location: [facility];  Service: Endoscopy;  Laterality: N/A;   WISDOM TOOTH EXTRACTION  1965    Prior to Admission medications   Medication Sig Start Date End Date Taking? Authorizing Provider  Calcium 200 MG TABS Take 1 tablet by mouth daily.   Yes [provider]  escitalopram (LEXAPRO) 10 MG tablet  06/13/20  Yes [provider]  Magnesium 250 MG TABS Take by mouth daily.   Yes [provider]  omeprazole (PRILOSEC) 40 MG capsule  06/13/20  Yes [provider]  oxybutynin (DITROPAN) 5 MG tablet oxybutynin chloride 5 mg tablet 06/14/18  Yes [provider]  rosuvastatin (CRESTOR) 20 MG tablet   04/20/20  Yes [provider]  SYNTHROID 100 MCG tablet  04/20/20  Yes [provider]  traMADol (ULTRAM) 50 MG tablet Take 50 mg by mouth every 6 (six) hours as needed.   Yes [provider]  Vitamin D, Ergocalciferol, (DRISDOL) 1.25 MG (50000 UNIT) CAPS capsule  06/13/20  Yes [provider]  diphenhydrAMINE (BENADRYL) 25 mg capsule Take 25 mg by mouth every 6 (six) hours as needed.    [provider]  naproxen sodium (ALEVE) 220 MG tablet Take 220 mg by mouth as needed.    [provider]  Nutritional Supplements (JUICE PLUS FIBRE PO) Take 1 tablet by mouth daily.    [provider]    Allergies as of 03/31/2024 - Review Complete 05/26/2023  Allergen Reaction Noted   Venlafaxine  07/20/2014   Penicillin g Itching and Rash 07/20/2014    History reviewed. No pertinent family history.  Social History   Socioeconomic History   Marital status: Single    Spouse name: Not on file   Number of children: Not on file   Years of education: Not on file   Highest education level: Not on file  Occupational History   Not on file  Tobacco Use   Smoking status: Never   Smokeless tobacco: Never  Vaping Use   Vaping status: Never Used  Substance and Sexual Activity   Alcohol use: Not Currently   Drug use: Never   Sexual activity: Not Currently  Other Topics Concern   Not on file  Social History Narrative   Not on  file   Social Drivers of Health   Financial Resource Strain: Low Risk (07/10/2024)   Received from [facility]   Overall Financial Resource Strain (CARDIA)    How hard is it for you to pay for the very basics like food, housing, medical care, and heating?: Not hard at all  Food Insecurity: No Food Insecurity (07/14/2024)   Received from [facility]   Hunger Vital Sign    Within the past 12 months, you worried that your food would run out before you got the money to buy more.: Never true    Within the past 12  months, the food you bought just didn't last and you didn't have money to get more.: Never true  Transportation Needs: No Transportation Needs (07/14/2024)   Received from [facility] - Transportation    Lack of Transportation (Medical): No    Lack of Transportation (Non-Medical): No  Physical Activity: Not on file  Stress: No Stress Concern Present (07/21/2023)   Received from [facility] - Occupational Stress Questionnaire    Feeling of Stress : Not at all  Social Connections: Not on file  Intimate Partner Violence: Not At Risk (07/21/2023)   Received from [facility]   Humiliation, Afraid, Rape, and Kick questionnaire    Within the last year, have you been afraid of your partner or ex-partner?: No    Within the last year, have you been humiliated or emotionally abused in other ways by your partner or ex-partner?: No    Within the last year, have you been kicked, hit, slapped, or otherwise physically hurt by your partner or ex-partner?: No    Within the last year, have you been raped or forced to have any kind of sexual activity by your partner or ex-partner?: No    Review of Systems: See HPI, otherwise negative ROS  Physical Exam: Ht 5' 1 (1.549 m)   Wt 77.1 kg   BMI 32.12 kg/m  General:   Alert,  pleasant and cooperative in NAD Head:  Normocephalic and atraumatic. Lungs:  Clear to auscultation.    Heart:  Regular rate and rhythm.   Impression/Plan: Sarah Barrera is here for ophthalmic surgery.  Risks, benefits, limitations, and alternatives regarding ophthalmic surgery have been reviewed with the patient.  Questions have been answered.  All parties agreeable.   Sarah GASKIN, MD  08/17/2024, 7:04 AM

## 2024-08-17 NOTE — Op Note (Signed)
 OPERATIVE NOTE  Sarah Barrera [id number] 08/17/2024   PREOPERATIVE DIAGNOSIS:  Nuclear sclerotic cataract left eye. H25.12   POSTOPERATIVE DIAGNOSIS:    Nuclear sclerotic cataract left eye.     PROCEDURE:  Phacoemusification with posterior chamber intraocular lens placement of the left eye  Ultrasound time: Procedure(s): PHACOEMULSIFICATION, CATARACT, WITH IOL INSERTION 8.15 00:37.9 (Left)  LENS:   Implant Name Type Inv. Item Serial No. Manufacturer Lot No. LRB No. Used Action  LENS IOL TECNIS EYHANCE 19.0 - [id number] Intraocular Lens LENS IOL TECNIS EYHANCE 19.0 [id number] [employer]  Left 1 Implanted      SURGEON:  Dene FABIENE Etienne, MD   ANESTHESIA:  Topical with tetracaine drops and 2% Xylocaine  jelly, augmented with 1% preservative-free intracameral lidocaine .    COMPLICATIONS:  None.   DESCRIPTION OF PROCEDURE:  The patient was identified in the holding room and transported to the operating room and placed in the supine position under the operating microscope.  The left eye was identified as the operative eye and it was prepped and draped in the usual sterile ophthalmic fashion.   A 1 millimeter clear-corneal paracentesis was made at the 1:30 position.  0.5 ml of preservative-free 1% lidocaine  was injected into the anterior chamber.  The anterior chamber was filled with Viscoat viscoelastic.  A 2.4 millimeter keratome was used to make a near-clear corneal incision at the 10:30 position.  .  A curvilinear capsulorrhexis was made with a cystotome and capsulorrhexis forceps.  Balanced salt solution was used to hydrodissect and hydrodelineate the nucleus.   Phacoemulsification was then used in stop and chop fashion to remove the lens nucleus and epinucleus.  The remaining cortex was then removed using the irrigation and aspiration handpiece. Provisc was then placed into the capsular bag to distend it for lens placement.  A lens was then injected into the capsular bag.  The  remaining viscoelastic was aspirated.   Wounds were hydrated with balanced salt solution.  The anterior chamber was inflated to a physiologic pressure with balanced salt solution.  No wound leaks were noted. Vigamox 0.2 ml of a 1mg  per ml solution was injected into the anterior chamber for a dose of 0.2 mg of intracameral antibiotic at the completion of the case.   Timolol and Brimonidine drops were applied to the eye.  The patient was taken to the recovery room in stable condition without complications of anesthesia or surgery.  [name] 08/17/2024, 8:20 AM

## 2024-08-17 NOTE — Anesthesia Postprocedure Evaluation (Signed)
 Anesthesia Post Note  Patient: Sarah Barrera  Procedure(s) Performed: PHACOEMULSIFICATION, CATARACT, WITH IOL INSERTION 8.15 00:37.9 (Left: Eye)  Patient location during evaluation: PACU Anesthesia Type: MAC Level of consciousness: awake and alert Pain management: pain level controlled Vital Signs Assessment: post-procedure vital signs reviewed and stable Respiratory status: spontaneous breathing, nonlabored ventilation, respiratory function stable and patient connected to nasal cannula oxygen Cardiovascular status: stable and blood pressure returned to baseline Postop Assessment: no apparent nausea or vomiting Anesthetic complications: no   No notable events documented.   Last Vitals:  Vitals:   08/17/24 0821 08/17/24 0825  BP: 120/73 117/69  Pulse: 70 70  Resp: 17 15  Temp: (!) 36 C (!) 36 C  SpO2: 98% 97%    Last Pain:  Vitals:   08/17/24 0825  TempSrc:   PainSc: 0-No pain                 Elira Colasanti C Romin Divita
